# Patient Record
Sex: Male | Born: 1990 | Race: White | Hispanic: No | Marital: Single | Smoking: Current some day smoker
Health system: Southern US, Community
[De-identification: ages and names within clinical notes are randomized; demographics above are authoritative.]

## PROBLEM LIST (undated history)

## (undated) DIAGNOSIS — R569 Unspecified convulsions: Secondary | ICD-10-CM

## (undated) DIAGNOSIS — F410 Panic disorder [episodic paroxysmal anxiety] without agoraphobia: Secondary | ICD-10-CM

## (undated) DIAGNOSIS — K219 Gastro-esophageal reflux disease without esophagitis: Secondary | ICD-10-CM

## (undated) DIAGNOSIS — T7840XA Allergy, unspecified, initial encounter: Secondary | ICD-10-CM

## (undated) DIAGNOSIS — I10 Essential (primary) hypertension: Secondary | ICD-10-CM

## (undated) DIAGNOSIS — F419 Anxiety disorder, unspecified: Secondary | ICD-10-CM

## (undated) DIAGNOSIS — K449 Diaphragmatic hernia without obstruction or gangrene: Secondary | ICD-10-CM

## (undated) DIAGNOSIS — F909 Attention-deficit hyperactivity disorder, unspecified type: Secondary | ICD-10-CM

## (undated) DIAGNOSIS — G709 Myoneural disorder, unspecified: Secondary | ICD-10-CM

## (undated) HISTORY — DX: Unspecified convulsions: R56.9

## (undated) HISTORY — DX: Gastro-esophageal reflux disease without esophagitis: K21.9

## (undated) HISTORY — PX: APPENDECTOMY: SHX54

## (undated) HISTORY — DX: Diaphragmatic hernia without obstruction or gangrene: K44.9

## (undated) HISTORY — DX: Essential (primary) hypertension: I10

## (undated) HISTORY — DX: Anxiety disorder, unspecified: F41.9

## (undated) HISTORY — DX: Myoneural disorder, unspecified: G70.9

## (undated) HISTORY — DX: Attention-deficit hyperactivity disorder, unspecified type: F90.9

## (undated) HISTORY — DX: Allergy, unspecified, initial encounter: T78.40XA

---
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
## 2015-01-23 ENCOUNTER — Ambulatory Visit (INDEPENDENT_AMBULATORY_CARE_PROVIDER_SITE_OTHER): Payer: BLUE CROSS/BLUE SHIELD | Admitting: Physician Assistant

## 2015-01-23 DIAGNOSIS — H9311 Tinnitus, right ear: Secondary | ICD-10-CM

## 2015-01-23 DIAGNOSIS — F909 Attention-deficit hyperactivity disorder, unspecified type: Secondary | ICD-10-CM | POA: Insufficient documentation

## 2015-01-23 NOTE — Progress Notes (Signed)
   Subjective:    Patient ID: Victor [name], male    DOB: [birth year], 24 y.o.   MRN: [id number]  Chief Complaint  Patient presents with  . Tinnitus    Right sided ringing started this am - works w/loud machines, plays the drums  . Ear Pain    Right sided   HPI Patient presents for evaluation of right ear tinnitus since this morning. 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 After practice, he had a HA until he went to bed. Patient is around loud machinery all the time at work, and on his way to work this morning, he noticed increased tinnitus, especially while accelerating. Was convinced it was his car, but   Review of Systems     Objective:   Physical Exam  Constitutional:  BP 128/72 mmHg  Pulse 69  Temp(Src) 98.6 F (37 C) (Oral)  Ht 5\' 7"  (1.702 m)  Wt 157 lb (71.215 kg)  BMI 24.58 kg/m2  SpO2 98%          Assessment & Plan:

## 2015-01-23 NOTE — Progress Notes (Signed)
   Victor [name]  MRN: [id number] DOB: 1990/09/24  Subjective:  Pt presents to clinic with ringing in his ear that started this am.  He was driving his car and he started to hear it and thought it was his car so he took it to the shop where they told him he did not have anything wrong with his car and then he noticed that at work today he kept asking people if they could hear a ringing.  It is just in his right ear - his hearing is a little muffled but he can still hear he thinks most of the problem is the ringing.  He does not have any pain.  He has not had recent cold and he has not had physical trauma to his ear.  He does play in a rock band and they play for a long time last night and he did not wear ear protection.  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  He has never had ringing in his ears before.  Patient Active Problem List   Diagnosis Date Noted  . Attention deficit hyperactivity disorder (ADHD) 01/23/2015    No current outpatient prescriptions on file prior to visit.   No current facility-administered medications on file prior to visit.    No Known Allergies  Review of Systems  Constitutional: Positive for fever.  HENT: Positive for tinnitus (right only). Negative for ear discharge, ear pain and hearing loss.    Objective:  BP 128/72 mmHg  Pulse 69  Temp(Src) 98.6 F (37 C) (Oral)  Ht 5\' 7"  (1.702 m)  Wt 157 lb (71.215 kg)  BMI 24.58 kg/m2  SpO2 98%  Physical Exam  Constitutional: He is oriented to person, place, and time and well-developed, well-nourished, and in no distress.  HENT:  Head: Normocephalic and atraumatic.  Right Ear: Hearing, external ear and ear canal normal. A middle ear effusion (slightly dull TM ) is present.  Left Ear: Hearing, tympanic membrane, external ear and ear canal normal.  Weber does not lateralize. Rhinne AC>BC bilaterally  Eyes:  Conjunctivae are normal.  Neck: Normal range of motion.  Pulmonary/Chest: Effort normal.  Neurological: He is alert and oriented to person, place, and time. Gait normal.  Skin: Skin is warm and dry.  Psychiatric: Mood, memory, affect and judgment normal.    Hearing Screening   Method: Otoacoustic emissions   125Hz  250Hz  500Hz  1000Hz  2000Hz  4000Hz  8000Hz   Right ear:   20 20 20 20    Left ear:   20 20 20 20      Assessment and Plan :  Tinnitus, right   He has a mild ETD in his right ear but I think that his tinnitus is related to his loud music last night therefore this should improve with time.  We discussed the importance of ear protection with loud noises.  He will contact me if it does not improve so we can refer him to a specialist.  Benny LennertSarah Weber PA-C  [facility] 01/23/2015 6:36 PM

## 2015-06-28 ENCOUNTER — Ambulatory Visit (INDEPENDENT_AMBULATORY_CARE_PROVIDER_SITE_OTHER): Payer: BLUE CROSS/BLUE SHIELD

## 2015-06-28 ENCOUNTER — Ambulatory Visit (INDEPENDENT_AMBULATORY_CARE_PROVIDER_SITE_OTHER): Payer: BLUE CROSS/BLUE SHIELD | Admitting: Physician Assistant

## 2015-06-28 VITALS — BP 108/70 | HR 94 | Temp 98.8°F | Resp 18

## 2015-06-28 DIAGNOSIS — S61411A Laceration without foreign body of right hand, initial encounter: Secondary | ICD-10-CM

## 2015-06-28 DIAGNOSIS — M79641 Pain in right hand: Secondary | ICD-10-CM | POA: Diagnosis not present

## 2015-06-28 DIAGNOSIS — S6991XA Unspecified injury of right wrist, hand and finger(s), initial encounter: Secondary | ICD-10-CM

## 2015-06-28 DIAGNOSIS — Z23 Encounter for immunization: Secondary | ICD-10-CM | POA: Diagnosis not present

## 2015-06-28 MED ORDER — IBUPROFEN 800 MG PO TABS: 800.0000 mg | ORAL_TABLET | Freq: Three times a day (TID) | ORAL | Status: DC | PRN

## 2015-06-28 MED ORDER — CEPHALEXIN 500 MG PO CAPS: 1000.0000 mg | ORAL_CAPSULE | Freq: Two times a day (BID) | ORAL | Status: DC

## 2015-06-28 NOTE — Progress Notes (Signed)
06/28/2015 4:48 PM   DOB: [birth year] / MRN: [id number]  SUBJECTIVE:  Victor [name] is a 25 y.o. male presenting for right hand pain and a laceration over the right 5th PIP.  This occurred at 2 am this morning when he punched a car window and the window broke.  He used toilet paper for hemostasis.  He complains of a change in function of the left MCP and associates tenderness.    He has No Known Allergies.   He  has a past medical history of ADHD (attention deficit hyperactivity disorder).    He  reports that he has been smoking Cigarettes.  He has been smoking about 0.01 packs per day. He has never used smokeless tobacco. He  has no sexual activity history on file. The patient  has no past surgical history on file.  His family history is not on file.  Review of Systems  Constitutional: Negative for fever.  Musculoskeletal: Positive for joint pain. Negative for falls.  Skin: Negative for rash.  Neurological: Negative for dizziness and headaches.    Problem list and medications reviewed and updated by myself where necessary, and exist elsewhere in the encounter.   OBJECTIVE:  BP 108/70 mmHg  Pulse 94  Temp(Src) 98.8 F (37.1 C)  Resp 18  SpO2 96%  Physical Exam  Constitutional: He is oriented to person, place, and time.  Cardiovascular: Normal rate and regular rhythm.   Pulmonary/Chest: Effort normal.  Musculoskeletal:       Hands: Neurological: He is alert and oriented to person, place, and time.   Risk and benefits discussed and verbal consent obtained. Anesthetic allergies reviewed. Patient anesthetized using 1:1 mix of 2% lidocaine without epi and Marcaine. The wound was cleansed thoroughly with soap and water. Sterile prep and drape. Wound closed with 3 HM throws using 3-0 Prolene suture material. Hemostasis achieved. Mupirocin applied to the wound and bandage placed. The patient tolerated well.   No results found for this or any previous visit (from the past 72  hour(s)).  Dg Hand Complete Right  06/28/2015  CLINICAL DATA:  Hit windshield with RIGHT hand.  Pain. EXAM: RIGHT HAND - COMPLETE 3+ VIEW COMPARISON:  None. FINDINGS: There is no evidence of fracture or dislocation. There is no evidence of arthropathy or other focal bone abnormality. Lateral soft tissue swelling. Radiodensity adjacent to the PIP joint fifth finger, presumed to represent hyperattenuating soft tissue or bandage. This does not have the appearance of a piece of glass or foreign body. IMPRESSION: Negative for fracture. Soft tissue hyper attenuation adjacent to the PIP joint fifth finger favored to represent bandage or hematoma. Electronically Signed   By: Elsie StainJohn T Curnes M.D.   On: 06/28/2015 13:29    ASSESSMENT AND PLAN  Victor [name] was seen today for laceration.  Diagnoses and all orders for this visit:  Hand injury, right, initial encounter: Negative for displaced fracture.  He has been placed in a splint and will see him back in three days.  Given the severity of the laceration with start him on Keflex 1000 bid. He will return in three days for a recheck to see Dr. Neva SeatGreene or myself.   -     DG Hand Complete Right; Future  Hand pain, right -     ibuprofen (ADVIL,MOTRIN) 800 MG tablet; Take 1 tablet (800 mg total) by mouth every 8 (eight) hours as needed.  Hand laceration, right, initial encounter -     Tdap vaccine greater than or equal to  7yo IM -     cephALEXin (KEFLEX) 500 MG capsule; Take 2 capsules (1,000 mg total) by mouth 2 (two) times daily.  The patient was advised to call or return to clinic if he does not see an improvement in symptoms or to seek the care of the closest emergency department if he worsens with the above plan.   Deliah Boston, MHS, PA-C [facility] 06/28/2015 4:48 PM

## 2015-06-28 NOTE — Patient Instructions (Signed)
     IF you received an x-ray today, you will receive an invoice from [facility]. Please contact [facility] at [phone] with questions or concerns regarding your invoice.   IF you received labwork today, you will receive an invoice from [facility]. Please contact [facility] at [phone] with questions or concerns regarding your invoice.   Our billing staff will not be able to assist you with questions regarding bills from these companies.  You will be contacted with the lab results as soon as they are available. The fastest way to get your results is to activate your My Chart account. Instructions are located on the last page of this paperwork. If you have not heard from us regarding the results in 2 weeks, please contact this office.      

## 2015-07-01 ENCOUNTER — Ambulatory Visit (INDEPENDENT_AMBULATORY_CARE_PROVIDER_SITE_OTHER): Payer: BLUE CROSS/BLUE SHIELD | Admitting: Physician Assistant

## 2015-07-01 VITALS — BP 120/80 | HR 79 | Temp 98.4°F | Resp 16 | Ht 67.5 in | Wt 164.8 lb

## 2015-07-01 DIAGNOSIS — S6991XD Unspecified injury of right wrist, hand and finger(s), subsequent encounter: Secondary | ICD-10-CM

## 2015-07-01 NOTE — Progress Notes (Signed)
Chief Complaint  Patient presents with  . Follow-up    hand injury    History of Present Illness: Patient presents for recheck after injury to the RIGHT hand 06/28/2015.  He punched the window of his car sustaining laceration to the dorsal aspect of the proximal 5th finger. No fracture seen on radiographs. No tendon injury identified. The wound was repaired and he was placed in a splint (ulnar gutter).  He presents today reporting decreased swelling and pain. Swelling and pain are worse at the end of the day. The splint is loose and wiggles a bit. He is tolerating the Keflex but with a little diarrhea, which is tolerable.   No Known Allergies  Prior to Admission medications   Medication Sig Start Date End Date Taking? Authorizing Provider  amphetamine-dextroamphetamine (ADDERALL) 10 MG tablet Take 10 mg by mouth as needed.   Yes Historical Provider, MD  cephALEXin (KEFLEX) 500 MG capsule Take 2 capsules (1,000 mg total) by mouth 2 (two) times daily. 06/28/15  Yes Ofilia NeasMichael L Clark, PA-C  ibuprofen (ADVIL,MOTRIN) 800 MG tablet Take 1 tablet (800 mg total) by mouth every 8 (eight) hours as needed. 06/28/15  Yes Ofilia NeasMichael L Clark, PA-C  lisdexamfetamine (VYVANSE) 40 MG capsule Take 40 mg by mouth every morning.   Yes Historical Provider, MD    Patient Active Problem List   Diagnosis Date Noted  . Attention deficit hyperactivity disorder (ADHD) 01/23/2015     Physical Exam  Constitutional: He is oriented to person, place, and time. He appears well-developed and well-nourished. He is active and cooperative. No distress.  BP 120/80 mmHg  Pulse 79  Temp(Src) 98.4 F (36.9 C) (Oral)  Resp 16  Ht 5' 7.5" (1.715 m)  Wt 164 lb 12.8 oz (74.753 kg)  BMI 25.42 kg/m2  SpO2 98%   Eyes: Conjunctivae are normal.  Pulmonary/Chest: Effort normal.  Musculoskeletal:  Splint removed and hand washed. Wound is closed and sutures intact. No erythema. No drainage. Edema is significant of the dorsum of the  RIGHT hand, with resolving ecchymosis. ROM of the wrist is "sore," but the wrist is non-tender. ROM of the fingers and hand is reduced, likely due to pain and swelling. He has intact strength, but mild pain of the 5th finger with extension against resistance.  Finger splint placed with coban for extra protection until suture removal.  Neurological: He is alert and oriented to person, place, and time.  Psychiatric: He has a normal mood and affect. His speech is normal and behavior is normal.      ASSESSMENT & PLAN:  1. Hand injury, right, subsequent encounter Improving, but with persistent pain and swelling. Smaller finger splint. Remove splint for bathing. Gentle ROM of the wrist and fingers, but if pain increases he needs to notify us and may need a more substantial splint.  Return in about 5 days (around 07/06/2015) for wound check/suture removal, sooner if pain increasing.   Fernande Brashelle S. Torrey Horseman, PA-C Physician Assistant-Certified [facility]

## 2015-07-01 NOTE — Patient Instructions (Signed)
     IF you received an x-ray today, you will receive an invoice from [facility]. Please contact [facility] at [phone] with questions or concerns regarding your invoice.   IF you received labwork today, you will receive an invoice from [facility]. Please contact [facility] at [phone] with questions or concerns regarding your invoice.   Our billing staff will not be able to assist you with questions regarding bills from these companies.  You will be contacted with the lab results as soon as they are available. The fastest way to get your results is to activate your My Chart account. Instructions are located on the last page of this paperwork. If you have not heard from us regarding the results in 2 weeks, please contact this office.      

## 2015-07-09 ENCOUNTER — Ambulatory Visit (INDEPENDENT_AMBULATORY_CARE_PROVIDER_SITE_OTHER): Payer: BLUE CROSS/BLUE SHIELD | Admitting: Family Medicine

## 2015-07-09 ENCOUNTER — Ambulatory Visit (INDEPENDENT_AMBULATORY_CARE_PROVIDER_SITE_OTHER): Payer: BLUE CROSS/BLUE SHIELD

## 2015-07-09 VITALS — BP 136/74 | HR 99 | Temp 98.2°F | Resp 16 | Ht 67.5 in | Wt 164.0 lb

## 2015-07-09 DIAGNOSIS — S61206D Unspecified open wound of right little finger without damage to nail, subsequent encounter: Secondary | ICD-10-CM

## 2015-07-09 DIAGNOSIS — M79641 Pain in right hand: Secondary | ICD-10-CM | POA: Diagnosis not present

## 2015-07-09 DIAGNOSIS — S61209D Unspecified open wound of unspecified finger without damage to nail, subsequent encounter: Secondary | ICD-10-CM

## 2015-07-09 NOTE — Patient Instructions (Addendum)
     IF you received an x-ray today, you will receive an invoice from [facility]. Please contact [facility] at [phone] with questions or concerns regarding your invoice.   IF you received labwork today, you will receive an invoice from [facility]. Please contact [facility] at [phone] with questions or concerns regarding your invoice.   Our billing staff will not be able to assist you with questions regarding bills from these companies.  You will be contacted with the lab results as soon as they are available. The fastest way to get your results is to activate your My Chart account. Instructions are located on the last page of this paperwork. If you have not heard from us regarding the results in 2 weeks, please contact this office.     Wound appears to be healing well. Try to avoid forceful grasping or gripping/heavy lifting with the right hand/wrist for the next week or 2, but if pain has resolved, can increase activity with that hand. If any increased redness or swelling of the hand or other worsening symptoms, return for recheck.  Good luck [place], let us know if we can help you before that time with stress or stress management.

## 2015-07-09 NOTE — Progress Notes (Signed)
Subjective:  By signing my name below, I, Victor [name], attest that this documentation has been prepared under the direction and in the presence of Meredith Staggers, MD. Electronically Signed: Stann [name], Scribe. 07/09/2015 , 4:24 PM .  Patient was seen in Room 1 .   Patient ID: Victor [name], male    DOB: 27-Aug-1990, 25 y.o.   MRN: [id number] Chief Complaint  Patient presents with  . Suture / Staple Removal    on right pinky; finger is a little sore   HPI Victor [name] is a 25 y.o. male He was seen initially by Deliah Boston, PA on 3/26 for a wound to his right hand after punching window glass causing a laceration over right 5th digit; he was repaired with 3 horizontal mattress sutures and also placed in a splint, started keflex  bid. Rechecked 3 days later, splint replaced, gentle rom recommended on 3/29 visit. His initial xray was negative for fracture.   Patient states the area is still feels a little sore. He's stopped using the splint 2 days ago because it was feeling better. He notes the top part of his hand has some pain. He denies any bleeding or pus draining from the wound.   He Administrator, arts for [employer].   Patient Active Problem List   Diagnosis Date Noted  . Attention deficit hyperactivity disorder (ADHD) 01/23/2015   Past Medical History  Diagnosis Date  . ADHD (attention deficit hyperactivity disorder)    History reviewed. No pertinent past surgical history. No Known Allergies Prior to Admission medications   Medication Sig Start Date End Date Taking? Authorizing Provider  amphetamine-dextroamphetamine (ADDERALL) 10 MG tablet Take 10 mg by mouth as needed.   Yes Historical Provider, MD  cephALEXin (KEFLEX) 500 MG capsule Take 2 capsules (1,000 mg total) by mouth 2 (two) times daily. 06/28/15  Yes [name], PA-C  ibuprofen (ADVIL,MOTRIN) 800 MG tablet Take 1 tablet (800 mg total) by mouth every 8 (eight) hours as needed. 06/28/15  Yes [name], PA-C  lisdexamfetamine (VYVANSE) 40 MG capsule Take 40 mg by mouth every morning.   Yes Historical Provider, MD   Social History   Social History  . Marital Status: Single    Spouse Name: n/a  . Number of Children: 0  . Years of Education: Associates   Occupational History  . Hidden Associate Professor    Social History Main Topics  . Smoking status: Former Smoker -- 0.01 packs/day    Types: Cigarettes    Quit date: 04/01/2015  . Smokeless tobacco: Never Used  . Alcohol Use: Not on file  . Drug Use: Not on file  . Sexual Activity: Not on file   Other Topics Concern  . Not on file   Social History Narrative   Review of Systems  Constitutional: Negative for fever, chills and fatigue.  Respiratory: Negative for cough.   Gastrointestinal: Negative for nausea, vomiting and diarrhea.  Musculoskeletal: Positive for myalgias. Negative for joint swelling and arthralgias.  Skin: Negative for rash and wound.  Neurological: Negative for weakness and numbness.      Objective:   Physical Exam  Constitutional: He is oriented to person, place, and time. He appears well-developed and well-nourished. No distress.  HENT:  Head: Normocephalic and atraumatic.  Eyes: EOM are normal. Pupils are equal, round, and reactive to light.  Neck: Neck supple.  Cardiovascular: Normal rate.   Pulmonary/Chest: Effort normal. No respiratory distress.  Musculoskeletal: Normal range of  motion.  Right 5th finger: there are 3 HM sutures intact, no drainage or bleeding, no surrounding erythema, no warmth, able to straighten without difficulty, possible prominence over right PIP, slight discomfort over lateral PIP; able to achieve full extension, 90 degrees flexion of the PIP, intact flexion strength at PIP Right hand: slight tenderness over dorsum of 4th metacarpal base with questionable bony prominence, scaphoid non tender, right wrist full rom, no bony tenderness   Neurological: He is alert and  oriented to person, place, and time.  Skin: Skin is warm and dry.  Psychiatric: He has a normal mood and affect. His behavior is normal.  Nursing note and vitals reviewed.   Filed Vitals:   07/09/15 1555  BP: 136/74  Pulse: 99  Temp: 98.2 F (36.8 C)  TempSrc: Oral  Resp: 16  Height: 5' 7.5" (1.715 m)  Weight: 164 lb (74.39 kg)  SpO2: 98%   Dg Hand Complete Right  07/09/2015  CLINICAL DATA:  Laceration of the right hand, evaluate for fracture EXAM: RIGHT HAND - COMPLETE 3+ VIEW COMPARISON:  Right hand films of 06/28/2015 FINDINGS: There is soft tissue swelling at the level of the right fifth PIP joint. However no fracture is seen and alignment is normal. Joint spaces appear normal. IMPRESSION: Negative. Electronically Signed   By: Dwyane Dee M.D.   On: 07/09/2015 16:37      Assessment & Plan:   [name] is a 24 y.o. male Wound, open, finger, subsequent encounter - Plan: DG Hand Complete Right  Right hand pain - Plan: DG Hand Complete Right Wound healing well, horizontal mattress sutures were removed without difficulty. Wound RTC precautions discussed. Avoid forceful grasping for another week or 2 continue range of motion. If pain persists, return for recheck.   -Denied any depression symptoms currently, does have number for counseling to his family has recommended for stress and stress management. Encouraged him to pursue this, and let us know if we can help further.  Upcoming hike on [place] trail may also help.   No orders of the defined types were placed in this encounter.   Patient Instructions       IF you received an x-ray today, you will receive an invoice from [facility]. Please contact [facility] at [phone] with questions or concerns regarding your invoice.   IF you received labwork today, you will receive an invoice from [facility]. Please contact [facility] at [phone] with questions or concerns  regarding your invoice.   Our billing staff will not be able to assist you with questions regarding bills from these companies.  You will be contacted with the lab results as soon as they are available. The fastest way to get your results is to activate your My Chart account. Instructions are located on the last page of this paperwork. If you have not heard from [place] regarding the results in 2 weeks, please contact this office.     Wound appears to be healing well. Try to avoid forceful grasping or gripping/heavy lifting with the right hand/wrist for the next week or 2, but if pain has resolved, can increase activity with that hand. If any increased redness or swelling of the hand or other worsening symptoms, return for recheck.  Good luck [place], let us know if we can help you before that time with stress or stress management.    I personally performed the services described in this documentation, which was scribed in my  presence. The recorded information has been reviewed and considered, and addended by me as needed.

## 2015-08-03 ENCOUNTER — Encounter (HOSPITAL_COMMUNITY): Payer: Self-pay | Admitting: *Deleted

## 2015-08-03 ENCOUNTER — Observation Stay (HOSPITAL_COMMUNITY): Payer: BLUE CROSS/BLUE SHIELD | Admitting: Anesthesiology

## 2015-08-03 ENCOUNTER — Emergency Department (HOSPITAL_COMMUNITY): Payer: BLUE CROSS/BLUE SHIELD

## 2015-08-03 ENCOUNTER — Observation Stay (HOSPITAL_COMMUNITY)
Admission: EM | Admit: 2015-08-03 | Discharge: 2015-08-04 | Disposition: A | Discharge status: Home / Self Care | Payer: BLUE CROSS/BLUE SHIELD | Attending: Surgery | Admitting: Surgery

## 2015-08-03 ENCOUNTER — Encounter (HOSPITAL_COMMUNITY)
Admission: EM | Disposition: A | Discharge status: Home / Self Care | Payer: Self-pay | Source: Home / Self Care | Attending: Emergency Medicine

## 2015-08-03 DIAGNOSIS — F419 Anxiety disorder, unspecified: Secondary | ICD-10-CM | POA: Diagnosis not present

## 2015-08-03 DIAGNOSIS — Z87891 Personal history of nicotine dependence: Secondary | ICD-10-CM | POA: Insufficient documentation

## 2015-08-03 DIAGNOSIS — F909 Attention-deficit hyperactivity disorder, unspecified type: Secondary | ICD-10-CM | POA: Diagnosis not present

## 2015-08-03 DIAGNOSIS — M79641 Pain in right hand: Secondary | ICD-10-CM

## 2015-08-03 DIAGNOSIS — F329 Major depressive disorder, single episode, unspecified: Secondary | ICD-10-CM | POA: Diagnosis not present

## 2015-08-03 DIAGNOSIS — K3580 Unspecified acute appendicitis: Secondary | ICD-10-CM | POA: Diagnosis present

## 2015-08-03 HISTORY — PX: LAPAROSCOPIC APPENDECTOMY: SHX408

## 2015-08-03 LAB — LIPASE, BLOOD: LIPASE: 17 U/L (ref 11–51)

## 2015-08-03 LAB — COMPREHENSIVE METABOLIC PANEL
ALK PHOS: 53 U/L (ref 38–126)
ALT: 42 U/L (ref 17–63)
AST: 40 U/L (ref 15–41)
Albumin: 4.2 g/dL (ref 3.5–5.0)
Anion gap: 12 (ref 5–15)
BUN: 12 mg/dL (ref 6–20)
CALCIUM: 9.7 mg/dL (ref 8.9–10.3)
CHLORIDE: 99 mmol/L — AB (ref 101–111)
CO2: 23 mmol/L (ref 22–32)
CREATININE: 0.8 mg/dL (ref 0.61–1.24)
GFR calc non Af Amer: >60 mL/min (ref >60)
GLUCOSE: 128 mg/dL — AB (ref 65–99)
Potassium: 4.1 mmol/L (ref 3.5–5.1)
SODIUM: 134 mmol/L — AB (ref 135–145)
Total Bilirubin: 0.8 mg/dL (ref 0.3–1.2)
Total Protein: 6.9 g/dL (ref 6.5–8.1)

## 2015-08-03 LAB — CBC
HCT: 42.1 % (ref 39.0–52.0)
Hemoglobin: 14.5 g/dL (ref 13.0–17.0)
MCH: 30.1 pg (ref 26.0–34.0)
MCHC: 34.4 g/dL (ref 30.0–36.0)
MCV: 87.3 fL (ref 78.0–100.0)
PLATELETS: 258 10*3/uL (ref 150–400)
RBC: 4.82 MIL/uL (ref 4.22–5.81)
RDW: 12.6 % (ref 11.5–15.5)
WBC: 14.5 10*3/uL — ABNORMAL HIGH (ref 4.0–10.5)

## 2015-08-03 LAB — URINALYSIS, ROUTINE W REFLEX MICROSCOPIC
BILIRUBIN URINE: NEGATIVE
GLUCOSE, UA: NEGATIVE mg/dL
HGB URINE DIPSTICK: NEGATIVE
KETONES UR: 15 mg/dL — AB
Leukocytes, UA: NEGATIVE
Nitrite: NEGATIVE
PROTEIN: NEGATIVE mg/dL
Specific Gravity, Urine: 1.028 (ref 1.005–1.030)
pH: 6.5 (ref 5.0–8.0)

## 2015-08-03 SURGERY — APPENDECTOMY, LAPAROSCOPIC
Anesthesia: General | Site: Abdomen

## 2015-08-03 MED ORDER — ONDANSETRON HCL 4 MG/2ML IJ SOLN
4.0000 mg | Freq: Four times a day (QID) | INTRAMUSCULAR | Status: DC | PRN
  Administered 2015-08-03: 4 mg via INTRAVENOUS
  Filled 2015-08-03: qty 2

## 2015-08-03 MED ORDER — DEXAMETHASONE SODIUM PHOSPHATE 10 MG/ML IJ SOLN
INTRAMUSCULAR | Status: AC
  Filled 2015-08-03: qty 1

## 2015-08-03 MED ORDER — OXYCODONE-ACETAMINOPHEN 5-325 MG PO TABS
1.0000 | ORAL_TABLET | ORAL | Status: DC | PRN
  Administered 2015-08-03 – 2015-08-04 (×2): 2 via ORAL
  Filled 2015-08-03 (×2): qty 2

## 2015-08-03 MED ORDER — SODIUM CHLORIDE 0.9 % IV SOLN
INTRAVENOUS | Status: DC
Start: 2015-08-03 — End: 2015-08-03
  Administered 2015-08-03: 10:00:00 via INTRAVENOUS

## 2015-08-03 MED ORDER — IBUPROFEN 600 MG PO TABS: 600.0000 mg | ORAL_TABLET | Freq: Four times a day (QID) | ORAL | Status: DC | PRN

## 2015-08-03 MED ORDER — DEXTROSE 5 % IV SOLN: 2.0000 g | INTRAVENOUS | Status: DC

## 2015-08-03 MED ORDER — SUGAMMADEX SODIUM 200 MG/2ML IV SOLN
INTRAVENOUS | Status: AC
  Filled 2015-08-03: qty 2

## 2015-08-03 MED ORDER — LISDEXAMFETAMINE DIMESYLATE 20 MG PO CAPS
40.0000 mg | ORAL_CAPSULE | Freq: Every morning | ORAL | Status: DC
  Filled 2015-08-03: qty 2

## 2015-08-03 MED ORDER — LACTATED RINGERS IV SOLN
INTRAVENOUS | Status: DC
  Administered 2015-08-03: 12:00:00 via INTRAVENOUS

## 2015-08-03 MED ORDER — FENTANYL CITRATE (PF) 250 MCG/5ML IJ SOLN
INTRAMUSCULAR | Status: AC
  Filled 2015-08-03: qty 5

## 2015-08-03 MED ORDER — METHOCARBAMOL 500 MG PO TABS
500.0000 mg | ORAL_TABLET | Freq: Four times a day (QID) | ORAL | Status: DC | PRN
  Administered 2015-08-04: 500 mg via ORAL
  Filled 2015-08-03: qty 1

## 2015-08-03 MED ORDER — ENOXAPARIN SODIUM 40 MG/0.4ML ~~LOC~~ SOLN
40.0000 mg | SUBCUTANEOUS | Status: DC
  Administered 2015-08-04: 40 mg via SUBCUTANEOUS
  Filled 2015-08-03: qty 0.4

## 2015-08-03 MED ORDER — DEXTROSE 5 % IV SOLN
2.0000 g | INTRAVENOUS | Status: DC
  Filled 2015-08-03: qty 2

## 2015-08-03 MED ORDER — DIPHENHYDRAMINE HCL 25 MG PO CAPS: 25.0000 mg | ORAL_CAPSULE | Freq: Four times a day (QID) | ORAL | Status: DC | PRN

## 2015-08-03 MED ORDER — DEXTROSE 5 % IV SOLN
2.0000 g | Freq: Once | INTRAVENOUS | Status: AC
  Administered 2015-08-03: 2 g via INTRAVENOUS
  Filled 2015-08-03: qty 2

## 2015-08-03 MED ORDER — LIDOCAINE HCL (CARDIAC) 20 MG/ML IV SOLN
INTRAVENOUS | Status: DC | PRN
  Administered 2015-08-03: 100 mg via INTRAVENOUS

## 2015-08-03 MED ORDER — DIPHENHYDRAMINE HCL 50 MG/ML IJ SOLN: 25.0000 mg | Freq: Four times a day (QID) | INTRAMUSCULAR | Status: DC | PRN

## 2015-08-03 MED ORDER — BUPIVACAINE-EPINEPHRINE 0.25% -1:200000 IJ SOLN
INTRAMUSCULAR | Status: DC | PRN
  Administered 2015-08-03: 6 mL

## 2015-08-03 MED ORDER — ONDANSETRON HCL 4 MG/2ML IJ SOLN: 4.0000 mg | Freq: Four times a day (QID) | INTRAMUSCULAR | Status: DC | PRN

## 2015-08-03 MED ORDER — FENTANYL CITRATE (PF) 100 MCG/2ML IJ SOLN
INTRAMUSCULAR | Status: DC | PRN
  Administered 2015-08-03: 50 ug via INTRAVENOUS
  Administered 2015-08-03: 100 ug via INTRAVENOUS
  Administered 2015-08-03: 50 ug via INTRAVENOUS

## 2015-08-03 MED ORDER — SUCCINYLCHOLINE CHLORIDE 200 MG/10ML IV SOSY
PREFILLED_SYRINGE | INTRAVENOUS | Status: AC
  Filled 2015-08-03: qty 10

## 2015-08-03 MED ORDER — HYDROMORPHONE HCL 1 MG/ML IJ SOLN: 0.2500 mg | INTRAMUSCULAR | Status: DC | PRN

## 2015-08-03 MED ORDER — ONDANSETRON HCL 4 MG/2ML IJ SOLN
4.0000 mg | Freq: Once | INTRAMUSCULAR | Status: AC
  Administered 2015-08-03: 4 mg via INTRAVENOUS
  Filled 2015-08-03: qty 2

## 2015-08-03 MED ORDER — SUGAMMADEX SODIUM 200 MG/2ML IV SOLN
INTRAVENOUS | Status: DC | PRN
  Administered 2015-08-03: 200 mg via INTRAVENOUS

## 2015-08-03 MED ORDER — METRONIDAZOLE IN NACL 5-0.79 MG/ML-% IV SOLN
500.0000 mg | Freq: Once | INTRAVENOUS | Status: AC
  Administered 2015-08-03: 500 mg via INTRAVENOUS
  Filled 2015-08-03: qty 100

## 2015-08-03 MED ORDER — ONDANSETRON HCL 4 MG/2ML IJ SOLN
INTRAMUSCULAR | Status: DC | PRN
  Administered 2015-08-03: 4 mg via INTRAVENOUS

## 2015-08-03 MED ORDER — HYDROMORPHONE HCL 1 MG/ML IJ SOLN
1.0000 mg | INTRAMUSCULAR | Status: DC | PRN
  Administered 2015-08-03 (×2): 1 mg via INTRAVENOUS
  Filled 2015-08-03 (×2): qty 1

## 2015-08-03 MED ORDER — ROCURONIUM BROMIDE 100 MG/10ML IV SOLN
INTRAVENOUS | Status: DC | PRN
  Administered 2015-08-03: 40 mg via INTRAVENOUS

## 2015-08-03 MED ORDER — METRONIDAZOLE IN NACL 5-0.79 MG/ML-% IV SOLN
500.0000 mg | Freq: Three times a day (TID) | INTRAVENOUS | Status: DC
  Administered 2015-08-03 – 2015-08-04 (×2): 500 mg via INTRAVENOUS
  Filled 2015-08-03 (×5): qty 100

## 2015-08-03 MED ORDER — ARTIFICIAL TEARS OP OINT
TOPICAL_OINTMENT | OPHTHALMIC | Status: DC | PRN
Start: 2015-08-03 — End: 2015-08-03
  Administered 2015-08-03: 1 via OPHTHALMIC

## 2015-08-03 MED ORDER — SODIUM CHLORIDE 0.9 % IR SOLN
Status: DC | PRN
  Administered 2015-08-03: 1000 mL

## 2015-08-03 MED ORDER — KETOROLAC TROMETHAMINE 30 MG/ML IJ SOLN
30.0000 mg | Freq: Four times a day (QID) | INTRAMUSCULAR | Status: DC
  Administered 2015-08-03 – 2015-08-04 (×3): 30 mg via INTRAVENOUS
  Filled 2015-08-03 (×3): qty 1

## 2015-08-03 MED ORDER — SODIUM CHLORIDE 0.9 % IV SOLN
INTRAVENOUS | Status: DC
  Administered 2015-08-03: 17:00:00 via INTRAVENOUS

## 2015-08-03 MED ORDER — HYDROMORPHONE HCL 1 MG/ML IJ SOLN: 0.5000 mg | INTRAMUSCULAR | Status: DC | PRN

## 2015-08-03 MED ORDER — LIDOCAINE 2% (20 MG/ML) 5 ML SYRINGE
INTRAMUSCULAR | Status: AC
  Filled 2015-08-03: qty 5

## 2015-08-03 MED ORDER — BUPIVACAINE-EPINEPHRINE (PF) 0.25% -1:200000 IJ SOLN
INTRAMUSCULAR | Status: AC
  Filled 2015-08-03: qty 30

## 2015-08-03 MED ORDER — SUCCINYLCHOLINE CHLORIDE 20 MG/ML IJ SOLN
INTRAMUSCULAR | Status: DC | PRN
  Administered 2015-08-03: 120 mg via INTRAVENOUS

## 2015-08-03 MED ORDER — METRONIDAZOLE IN NACL 5-0.79 MG/ML-% IV SOLN
500.0000 mg | Freq: Three times a day (TID) | INTRAVENOUS | Status: DC
  Administered 2015-08-03: 500 mg via INTRAVENOUS
  Filled 2015-08-03 (×2): qty 100

## 2015-08-03 MED ORDER — 0.9 % SODIUM CHLORIDE (POUR BTL) OPTIME
TOPICAL | Status: DC | PRN
  Administered 2015-08-03: 1000 mL

## 2015-08-03 MED ORDER — ONDANSETRON HCL 4 MG/2ML IJ SOLN
INTRAMUSCULAR | Status: AC
  Filled 2015-08-03: qty 4

## 2015-08-03 MED ORDER — ONDANSETRON 4 MG PO TBDP: 4.0000 mg | ORAL_TABLET | Freq: Four times a day (QID) | ORAL | Status: DC | PRN

## 2015-08-03 MED ORDER — PROPOFOL 10 MG/ML IV BOLUS
INTRAVENOUS | Status: AC
  Filled 2015-08-03: qty 20

## 2015-08-03 MED ORDER — ONDANSETRON HCL 4 MG/2ML IJ SOLN
INTRAMUSCULAR | Status: AC
  Filled 2015-08-03: qty 2

## 2015-08-03 MED ORDER — MIDAZOLAM HCL 5 MG/5ML IJ SOLN
INTRAMUSCULAR | Status: DC | PRN
  Administered 2015-08-03: 2 mg via INTRAVENOUS

## 2015-08-03 MED ORDER — KETOROLAC TROMETHAMINE 30 MG/ML IJ SOLN: 30.0000 mg | Freq: Four times a day (QID) | INTRAMUSCULAR | Status: DC | PRN

## 2015-08-03 MED ORDER — ONDANSETRON HCL 4 MG/2ML IJ SOLN: 4.0000 mg | Freq: Once | INTRAMUSCULAR | Status: DC | PRN

## 2015-08-03 MED ORDER — IOPAMIDOL (ISOVUE-300) INJECTION 61%
INTRAVENOUS | Status: AC
  Administered 2015-08-03: 100 mL
  Filled 2015-08-03: qty 100

## 2015-08-03 MED ORDER — DEXAMETHASONE SODIUM PHOSPHATE 10 MG/ML IJ SOLN
INTRAMUSCULAR | Status: DC | PRN
  Administered 2015-08-03: 8 mg via INTRAVENOUS

## 2015-08-03 MED ORDER — PROPOFOL 10 MG/ML IV BOLUS
INTRAVENOUS | Status: DC | PRN
  Administered 2015-08-03: 50 mg via INTRAVENOUS
  Administered 2015-08-03: 150 mg via INTRAVENOUS

## 2015-08-03 MED ORDER — MIDAZOLAM HCL 2 MG/2ML IJ SOLN
INTRAMUSCULAR | Status: AC
  Filled 2015-08-03: qty 2

## 2015-08-03 MED ORDER — HYDROMORPHONE HCL 1 MG/ML IJ SOLN
0.5000 mg | INTRAMUSCULAR | Status: AC | PRN
  Administered 2015-08-03 (×3): 0.5 mg via INTRAVENOUS
  Filled 2015-08-03 (×3): qty 1

## 2015-08-03 MED ORDER — ROCURONIUM BROMIDE 50 MG/5ML IV SOLN
INTRAVENOUS | Status: AC
  Filled 2015-08-03: qty 1

## 2015-08-03 MED ORDER — POTASSIUM CHLORIDE IN NACL 20-0.9 MEQ/L-% IV SOLN: INTRAVENOUS | Status: DC

## 2015-08-03 MED ORDER — SODIUM CHLORIDE 0.9 % IV BOLUS (SEPSIS)
1000.0000 mL | Freq: Once | INTRAVENOUS | Status: AC
  Administered 2015-08-03: 1000 mL via INTRAVENOUS

## 2015-08-03 SURGICAL SUPPLY — 46 items
APL SKNCLS STERI-STRIP NONHPOA (GAUZE/BANDAGES/DRESSINGS) ×1
APPLIER CLIP ROT 10 11.4 M/L (STAPLE)
APR CLP MED LRG 11.4X10 (STAPLE)
BAG SPEC RTRVL LRG 6X4 10 (ENDOMECHANICALS) ×1
BENZOIN TINCTURE PRP APPL 2/3 (GAUZE/BANDAGES/DRESSINGS) ×2 IMPLANT
BLADE SURG ROTATE 9660 (MISCELLANEOUS) IMPLANT
CANISTER SUCTION 2500CC (MISCELLANEOUS) ×2 IMPLANT
CHLORAPREP W/TINT 26ML (MISCELLANEOUS) ×2 IMPLANT
CLIP APPLIE ROT 10 11.4 M/L (STAPLE) IMPLANT
COVER SURGICAL LIGHT HANDLE (MISCELLANEOUS) ×2 IMPLANT
CUTTER FLEX LINEAR 45M (STAPLE) ×2 IMPLANT
DRSG TEGADERM 2-3/8X2-3/4 SM (GAUZE/BANDAGES/DRESSINGS) ×4 IMPLANT
DRSG TEGADERM 4X4.75 (GAUZE/BANDAGES/DRESSINGS) ×2 IMPLANT
ELECT REM PT RETURN 9FT ADLT (ELECTROSURGICAL) ×2
ELECTRODE REM PT RTRN 9FT ADLT (ELECTROSURGICAL) ×1 IMPLANT
ENDOLOOP SUT PDS II  0 18 (SUTURE)
ENDOLOOP SUT PDS II 0 18 (SUTURE) IMPLANT
FILTER SMOKE EVAC LAPAROSHD (FILTER) ×2 IMPLANT
GAUZE SPONGE 2X2 8PLY STRL LF (GAUZE/BANDAGES/DRESSINGS) ×1 IMPLANT
GLOVE BIO SURGEON STRL SZ 6.5 (GLOVE) ×1 IMPLANT
GLOVE BIO SURGEON STRL SZ7 (GLOVE) ×2 IMPLANT
GLOVE BIOGEL PI IND STRL 7.0 (GLOVE) IMPLANT
GLOVE BIOGEL PI IND STRL 7.5 (GLOVE) ×1 IMPLANT
GLOVE BIOGEL PI INDICATOR 7.0 (GLOVE) ×1
GLOVE BIOGEL PI INDICATOR 7.5 (GLOVE) ×1
GOWN STRL REUS W/ TWL LRG LVL3 (GOWN DISPOSABLE) ×3 IMPLANT
GOWN STRL REUS W/TWL LRG LVL3 (GOWN DISPOSABLE) ×6
KIT BASIN OR (CUSTOM PROCEDURE TRAY) ×2 IMPLANT
KIT ROOM TURNOVER OR (KITS) ×2 IMPLANT
NS IRRIG 1000ML POUR BTL (IV SOLUTION) ×2 IMPLANT
PAD ARMBOARD 7.5X6 YLW CONV (MISCELLANEOUS) ×4 IMPLANT
POUCH SPECIMEN RETRIEVAL 10MM (ENDOMECHANICALS) ×2 IMPLANT
RELOAD STAPLE 45 3.5 BLU ETS (ENDOMECHANICALS) ×1 IMPLANT
RELOAD STAPLE TA45 3.5 REG BLU (ENDOMECHANICALS) ×2 IMPLANT
SCALPEL HARMONIC ACE (MISCELLANEOUS) ×2 IMPLANT
SET IRRIG TUBING LAPAROSCOPIC (IRRIGATION / IRRIGATOR) ×2 IMPLANT
SPECIMEN JAR SMALL (MISCELLANEOUS) ×2 IMPLANT
SPONGE GAUZE 2X2 STER 10/PKG (GAUZE/BANDAGES/DRESSINGS) ×1
STRIP CLOSURE SKIN 1/2X4 (GAUZE/BANDAGES/DRESSINGS) ×2 IMPLANT
SUT MNCRL AB 4-0 PS2 18 (SUTURE) ×2 IMPLANT
TOWEL OR 17X24 6PK STRL BLUE (TOWEL DISPOSABLE) ×2 IMPLANT
TOWEL OR 17X26 10 PK STRL BLUE (TOWEL DISPOSABLE) ×2 IMPLANT
TRAY LAPAROSCOPIC MC (CUSTOM PROCEDURE TRAY) ×2 IMPLANT
TROCAR XCEL BLADELESS 5X75MML (TROCAR) ×4 IMPLANT
TROCAR XCEL BLUNT TIP 100MML (ENDOMECHANICALS) ×2 IMPLANT
TUBING INSUFFLATION (TUBING) ×2 IMPLANT

## 2015-08-03 NOTE — ED Notes (Signed)
Patient states about  2 this AM he woke up with extreme abd pain

## 2015-08-03 NOTE — ED Provider Notes (Signed)
CSN: [id number]     Arrival date & time 08/03/15  0611 History   First MD Initiated Contact with Patient 08/03/15 [phone]     Chief Complaint  Patient presents with  . Abdominal Pain    Patient is a 25 y.o. male presenting with abdominal pain. The history is provided by the patient.  Abdominal Pain Pain location:  Epigastric Pain quality: stabbing   Pain quality comment:  Initially felt like a vague ache, now is a sharp pain in the upper abdomen Pain radiation: toward the lower abdomen. Pain severity:  Severe Onset quality:  Gradual Duration: started last night before bed but it was mild. Timing:  Constant Progression:  Waxing and waning Context: not recent illness, not recent travel, not sick contacts and not trauma   Relieved by:  Nothing Exacerbated by: lying on the left side. Ineffective treatments:  None tried Associated symptoms: nausea and vomiting   Associated symptoms: no constipation, no diarrhea, no fever and no hematuria     Past Medical History  Diagnosis Date  . ADHD (attention deficit hyperactivity disorder)    History reviewed. No pertinent past surgical history. No family history on file. Social History  Substance Use Topics  . Smoking status: Former Smoker -- 0.01 packs/day    Types: Cigarettes    Quit date: 04/01/2015  . Smokeless tobacco: Never Used  . Alcohol Use: 0.0 oz/week    0 Standard drinks or equivalent per week    Review of Systems  Constitutional: Negative for fever.  Gastrointestinal: Positive for nausea, vomiting and abdominal pain. Negative for diarrhea and constipation.  Genitourinary: Negative for hematuria.  All other systems reviewed and are negative.     Allergies  Review of patient's allergies indicates no known allergies.  Home Medications   Prior to Admission medications   Medication Sig Start Date End Date Taking? Authorizing Provider  amphetamine-dextroamphetamine (ADDERALL) 10 MG tablet Take 10 mg by mouth as needed.    Yes Historical Provider, MD  ibuprofen (ADVIL,MOTRIN) 800 MG tablet Take 1 tablet (800 mg total) by mouth every 8 (eight) hours as needed. 06/28/15  Yes Ofilia NeasMichael L Clark, PA-C  lisdexamfetamine (VYVANSE) 40 MG capsule Take 40 mg by mouth every morning.   Yes Historical Provider, MD  cephALEXin (KEFLEX) 500 MG capsule Take 2 capsules (1,000 mg total) by mouth 2 (two) times daily. 06/28/15   Ofilia NeasMichael L Clark, PA-C   BP 128/78 mmHg  Pulse 62  Temp(Src) 97.6 F (36.4 C) (Oral)  Resp 17  Ht 5\' 7"  (1.702 m)  Wt 74.844 kg  BMI 25.84 kg/m2  SpO2 100% Physical Exam  Constitutional: He appears well-developed and well-nourished. No distress.  HENT:  Head: Normocephalic and atraumatic.  Right Ear: External ear normal.  Left Ear: External ear normal.  Eyes: Conjunctivae are normal. Right eye exhibits no discharge. Left eye exhibits no discharge. No scleral icterus.  Neck: Neck supple. No tracheal deviation present.  Cardiovascular: Normal rate, regular rhythm and intact distal pulses.   Pulmonary/Chest: Effort normal and breath sounds normal. No stridor. No respiratory distress. He has no wheezes. He has no rales.  Abdominal: Soft. Bowel sounds are normal. He exhibits no distension. There is tenderness in the right lower quadrant and epigastric area. There is no rigidity, no rebound and no guarding. No hernia.  Musculoskeletal: He exhibits no edema or tenderness.  Neurological: He is alert. He has normal strength. No cranial nerve deficit (no facial droop, extraocular movements intact, no slurred speech) or  sensory deficit. He exhibits normal muscle tone. He displays no seizure activity. Coordination normal.  Skin: Skin is warm and dry. No rash noted.  Psychiatric: He has a normal mood and affect.  Nursing note and vitals reviewed.   ED Course  Procedures   Medications  sodium chloride 0.9 % bolus 1,000 mL (0 mLs Intravenous Stopped 08/03/15 1006)    And  0.9 %  sodium chloride infusion (not  administered)  HYDROmorphone (DILAUDID) injection 0.5 mg (0.5 mg Intravenous Given 08/03/15 0942)  cefTRIAXone (ROCEPHIN) 2 g in dextrose 5 % 50 mL IVPB (not administered)    And  metroNIDAZOLE (FLAGYL) IVPB 500 mg (not administered)  ondansetron (ZOFRAN) injection 4 mg (4 mg Intravenous Given 08/03/15 0903)  iopamidol (ISOVUE-300) 61 % injection (100 mLs  Contrast Given 08/03/15 0926)    Labs Review Labs Reviewed  COMPREHENSIVE METABOLIC PANEL - Abnormal; Notable for the following:    Sodium 134 (*)    Chloride 99 (*)    Glucose, Bld 128 (*)    All other components within normal limits  CBC - Abnormal; Notable for the following:    WBC 14.5 (*)    All other components within normal limits  URINALYSIS, ROUTINE W REFLEX MICROSCOPIC (NOT AT [facility]) - Abnormal; Notable for the following:    Color, Urine AMBER (*)    Ketones, ur 15 (*)    All other components within normal limits  LIPASE, BLOOD    Imaging Review Ct Abdomen Pelvis W Contrast  08/03/2015  CLINICAL DATA:  Diffuse abdominal pain and vomiting.  Hematemesis. EXAM: CT ABDOMEN AND PELVIS WITH CONTRAST TECHNIQUE: Multidetector CT imaging of the abdomen and pelvis was performed using the standard protocol following bolus administration of intravenous contrast. CONTRAST:  ISOVUE-300 IOPAMIDOL (ISOVUE-300) INJECTION 61% COMPARISON:  None. FINDINGS: Lower chest:  Normal. Hepatobiliary: Normal. Pancreas: Normal. Spleen: Normal. Adrenals/Urinary Tract: Normal. Stomach/Bowel: There is acute appendicitis. There is diffuse enlargement of the appendix with abnormal enhancement as well slight periappendiceal soft tissue inflammation. The bowel is otherwise normal. Vascular/Lymphatic: Normal. Reproductive: Normal. Other: No free air or free fluid. Musculoskeletal: Normal. IMPRESSION: Acute appendicitis. Critical Value/emergent results were called by telephone at the time of interpretation on 08/03/2015 at 10:00 am to Dr. Linwood Dibbles , who verbally  acknowledged these results. Electronically Signed   By: Francene Boyers M.D.   On: 08/03/2015 10:00   I have personally reviewed and evaluated these images and lab results as part of my medical decision-making.    MDM   Final diagnoses:  Acute appendicitis, unspecified acute appendicitis type    Patient presented to the emergency room with complaints of abdominal pain that started within the last 24 hours. The sx have increased in severity and intensity. Patient does have tenderness to palpation in the right lower quadrant. CT scan was performed to evaluate for possible acute appendicitis.  CT confirms the diagnosis. There is no evidence of perforation or abscess.  I started the patient on ceftriaxone and metronidazole. I will consult the surgical service for admission.    Linwood Dibbles, MD 08/03/15 1017

## 2015-08-03 NOTE — ED Notes (Signed)
[facility]all belongings at bedside with family.  Consent at bedside.

## 2015-08-03 NOTE — Anesthesia Postprocedure Evaluation (Signed)
Anesthesia Post Note  Patient: Victor [name]  Procedure(s) Performed: Procedure(s) (LRB): APPENDECTOMY LAPAROSCOPIC (N/A)  Patient location during evaluation: PACU Anesthesia Type: General Level of consciousness: sedated and patient cooperative Pain management: pain level controlled Vital Signs Assessment: post-procedure vital signs reviewed and stable Respiratory status: spontaneous breathing Cardiovascular status: stable Anesthetic complications: no    Last Vitals:  Filed Vitals:   08/03/15 1516 08/03/15 1617  BP:  109/56  Pulse:  49  Temp: 36.4 C 37 C  Resp:  15    Last Pain:  Filed Vitals:   08/03/15 1632  PainSc: 0-No pain                 Lewie LoronJohn Clydell Alberts

## 2015-08-03 NOTE — ED Notes (Signed)
Patient transported to CT 

## 2015-08-03 NOTE — Anesthesia Preprocedure Evaluation (Signed)
Anesthesia Evaluation  Patient identified by MRN, date of birth, ID band Patient awake    Reviewed: Allergy & Precautions, NPO status , Patient's Chart, lab work & pertinent test results  History of Anesthesia Complications Negative for: history of anesthetic complications  Airway Mallampati: II  TM Distance: >3 FB Neck ROM: Full    Dental no notable dental hx. (+) Dental Advisory Given   Pulmonary former smoker,    Pulmonary exam normal breath sounds clear to auscultation       Cardiovascular negative cardio ROS Normal cardiovascular exam Rhythm:Regular Rate:Normal     Neuro/Psych PSYCHIATRIC DISORDERS (ADHD) negative neurological ROS  negative psych ROS   GI/Hepatic negative GI ROS, Neg liver ROS,   Endo/Other  negative endocrine ROS  Renal/GU negative Renal ROS  negative genitourinary   Musculoskeletal negative musculoskeletal ROS (+)   Abdominal   Peds negative pediatric ROS (+)  Hematology negative hematology ROS (+)   Anesthesia Other Findings   Reproductive/Obstetrics negative OB ROS                             Anesthesia Physical Anesthesia Plan  ASA: II  Anesthesia Plan: General   Post-op Pain Management:    Induction: Intravenous  Airway Management Planned: Oral ETT  Additional Equipment:   Intra-op Plan:   Post-operative Plan: Extubation in OR  Informed Consent: I have reviewed the patients History and Physical, chart, labs and discussed the procedure including the risks, benefits and alternatives for the proposed anesthesia with the patient or authorized representative who has indicated his/her understanding and acceptance.   Dental advisory given  Plan Discussed with: CRNA  Anesthesia Plan Comments:         Anesthesia Quick Evaluation

## 2015-08-03 NOTE — H&P (Signed)
Victor [name] is an 25 y.o. male.   PCP:  Harle Battiest  Psychiatrist:  Dr. Stan Head  Chief Complaint: acute onset of abdominal pain, nausea and vomiting HPI: Pt presented to ED with above complaints.  He said pain started last PM around 11 PM, he went to bed but woke up with more pain that was not relived and he came to the ED this AM. He could not sleep after he woke up 2-3 AM.    Work up in the ED shows:  Afebrile, VSS.  WBC is up to 14.5, other labs are normal.  CT scan shows: There is acute appendicitis. There is diffuse enlargement of the appendix with abnormal enhancement as well slight periappendiceal soft tissue inflammation. The bowel is otherwise normal.  Past Medical History  Diagnosis Date  . ADHD (attention deficit hyperactivity disorder)     History reviewed. No pertinent past surgical history.  No family history on file. Social History:  reports that he quit smoking about 4 months ago. His smoking use included Cigarettes. He smoked 0.01 packs per day. He has never used smokeless tobacco. He reports that he drinks alcohol. He reports that he does not use illicit drugs.  Allergies: No Known Allergies  Prior to Admission medications   Medication Sig Start Date End Date Taking? Authorizing Provider  amphetamine-dextroamphetamine (ADDERALL) 10 MG tablet Take 10 mg by mouth as needed.   Yes Historical Provider, MD  ibuprofen (ADVIL,MOTRIN) 800 MG tablet Take 1 tablet (800 mg total) by mouth every 8 (eight) hours as needed. 06/28/15  Yes Tereasa Coop, PA-C  lisdexamfetamine (VYVANSE) 40 MG capsule Take 40 mg by mouth every morning.   Yes Historical Provider, MD  cephALEXin (KEFLEX) 500 MG capsule Take 2 capsules (1,000 mg total) by mouth 2 (two) times daily. 06/28/15   Tereasa Coop, PA-C     Results for orders placed or performed during the hospital encounter of 08/03/15 (from the past 48 hour(s))  Lipase, blood     Status: None   Collection Time: 08/03/15  6:23 AM   Result Value Ref Range   Lipase 17 11 - 51 U/L  Comprehensive metabolic panel     Status: Abnormal   Collection Time: 08/03/15  6:23 AM  Result Value Ref Range   Sodium 134 (L) 135 - 145 mmol/L   Potassium 4.1 3.5 - 5.1 mmol/L   Chloride 99 (L) 101 - 111 mmol/L   CO2 23 22 - 32 mmol/L   Glucose, Bld 128 (H) 65 - 99 mg/dL   BUN 12 6 - 20 mg/dL   Creatinine, Ser 0.80 0.61 - 1.24 mg/dL   Calcium 9.7 8.9 - 10.3 mg/dL   Total Protein 6.9 6.5 - 8.1 g/dL   Albumin 4.2 3.5 - 5.0 g/dL   AST 40 15 - 41 U/L   ALT 42 17 - 63 U/L   Alkaline Phosphatase 53 38 - 126 U/L   Total Bilirubin 0.8 0.3 - 1.2 mg/dL   GFR calc non Af Amer >60 >60 mL/min   GFR calc Af Amer >60 >60 mL/min    Comment: (NOTE) The eGFR has been calculated using the CKD EPI equation. This calculation has not been validated in all clinical situations. eGFR's persistently <60 mL/min signify possible Chronic Kidney Disease.    Anion gap 12 5 - 15  CBC     Status: Abnormal   Collection Time: 08/03/15  6:23 AM  Result Value Ref Range   WBC 14.5 (  H) 4.0 - 10.5 K/uL   RBC 4.82 4.22 - 5.81 MIL/uL   Hemoglobin 14.5 13.0 - 17.0 g/dL   HCT 42.1 39.0 - 52.0 %   MCV 87.3 78.0 - 100.0 fL   MCH 30.1 26.0 - 34.0 pg   MCHC 34.4 30.0 - 36.0 g/dL   RDW 12.6 11.5 - 15.5 %   Platelets 258 150 - 400 K/uL  Urinalysis, Routine w reflex microscopic     Status: Abnormal   Collection Time: 08/03/15  6:23 AM  Result Value Ref Range   Color, Urine AMBER (A) YELLOW    Comment: BIOCHEMICALS MAY BE AFFECTED BY COLOR   APPearance CLEAR CLEAR   Specific Gravity, Urine 1.028 1.005 - 1.030   pH 6.5 5.0 - 8.0   Glucose, UA NEGATIVE NEGATIVE mg/dL   Hgb urine dipstick NEGATIVE NEGATIVE   Bilirubin Urine NEGATIVE NEGATIVE   Ketones, ur 15 (A) NEGATIVE mg/dL   Protein, ur NEGATIVE NEGATIVE mg/dL   Nitrite NEGATIVE NEGATIVE   Leukocytes, UA NEGATIVE NEGATIVE    Comment: MICROSCOPIC NOT DONE ON URINES WITH NEGATIVE PROTEIN, BLOOD, LEUKOCYTES,  NITRITE, OR GLUCOSE <1000 mg/dL.   Ct Abdomen Pelvis W Contrast  08/03/2015  CLINICAL DATA:  Diffuse abdominal pain and vomiting.  Hematemesis. EXAM: CT ABDOMEN AND PELVIS WITH CONTRAST TECHNIQUE: Multidetector CT imaging of the abdomen and pelvis was performed using the standard protocol following bolus administration of intravenous contrast. CONTRAST:  162m ISOVUE-300 IOPAMIDOL (ISOVUE-300) INJECTION 61% COMPARISON:  None. FINDINGS: Lower chest:  Normal. Hepatobiliary: Normal. Pancreas: Normal. Spleen: Normal. Adrenals/Urinary Tract: Normal. Stomach/Bowel: There is acute appendicitis. There is diffuse enlargement of the appendix with abnormal enhancement as well slight periappendiceal soft tissue inflammation. The bowel is otherwise normal. Vascular/Lymphatic: Normal. Reproductive: Normal. Other: No free air or free fluid. Musculoskeletal: Normal. IMPRESSION: Acute appendicitis. Critical Value/emergent results were called by telephone at the time of interpretation on 08/03/2015 at 10:00 am to Dr. JDorie Rank, who verbally acknowledged these results. Electronically Signed   By: JLorriane ShireM.D.   On: 08/03/2015 10:00    Review of Systems  Constitutional: Positive for chills. Negative for fever.  HENT: Negative.   Respiratory: Negative.   Cardiovascular: Negative.   Gastrointestinal: Positive for heartburn (occasional), nausea, vomiting (Nausea and vomiting in the ED lobby with some blood in it.) and abdominal pain. Negative for diarrhea, constipation, blood in stool and melena.  Genitourinary: Negative.   Musculoskeletal: Positive for back pain (sore from his job).  Skin: Negative.   Neurological: Negative.   Endo/Heme/Allergies: Negative.   Psychiatric/Behavioral: Positive for depression. The patient is nervous/anxious.     Blood pressure 128/78, pulse 62, temperature 97.6 F (36.4 C), temperature source Oral, resp. rate 17, height '5\' 7"'$  (1.702 m), weight 74.844 kg (165 lb), SpO2 100  %. Physical Exam  Constitutional: He is oriented to person, place, and time. He appears well-developed and well-nourished.  HENT:  Head: Normocephalic and atraumatic.  Nose: Nose normal.  Eyes: Conjunctivae and EOM are normal. Right eye exhibits no discharge. Left eye exhibits no discharge. No scleral icterus.  Neck: Normal range of motion. Neck supple. No JVD present. No tracheal deviation present. No thyromegaly present.  Cardiovascular: Normal rate, regular rhythm, normal heart sounds and intact distal pulses.   No murmur heard. Respiratory: Effort normal and breath sounds normal. No respiratory distress. He has no wheezes. He has no rales. He exhibits no tenderness.  GI: Soft. He exhibits distension. There is tenderness (tenderness has been  in the mid upper and then the lower abdomen.  Now he is tender mid abdomen, but more so in the RLQ currently).  Musculoskeletal: He exhibits no edema.  Lymphadenopathy:    He has no cervical adenopathy.  Neurological: He is alert and oriented to person, place, and time. No cranial nerve deficit.  Skin: Skin is warm and dry. No rash noted. He is not diaphoretic. No erythema. No pallor.  Psychiatric: He has a normal mood and affect. His behavior is normal. Judgment and thought content normal.     Assessment/Plan Acute appendicitis ADHD Hx of anxiety and depression  Plan:  IV antibiotics, IV fluids and post for surgery/appendectomy today.  Risk and benefits discussed with patient and his mother.  They understand and agree.   Keshona Kartes, PA-C 08/03/2015, 10:10 AM

## 2015-08-03 NOTE — Anesthesia Procedure Notes (Addendum)
Procedure Name: Intubation Date/Time: 08/03/2015 12:19 PM Performed by: Wray KearnsFOLEY, Lillianne Eick A Pre-anesthesia Checklist: Patient identified, Emergency Drugs available, Timeout performed, Suction available and Patient being monitored Patient Re-evaluated:Patient Re-evaluated prior to inductionOxygen Delivery Method: Circle system utilized Preoxygenation: Pre-oxygenation with 100% oxygen Intubation Type: IV induction, Cricoid Pressure applied and Rapid sequence Laryngoscope Size: Mac and 4 Grade View: Grade I Tube type: Oral Tube size: 7.5 mm Number of attempts: 1 Airway Equipment and Method: Stylet Placement Confirmation: positive ETCO2,  ETT inserted through vocal cords under direct vision and breath sounds checked- equal and bilateral Secured at: 23 cm Tube secured with: Tape Dental Injury: Teeth and Oropharynx as per pre-operative assessment

## 2015-08-03 NOTE — Op Note (Signed)
Appendectomy, Lap, Procedure Note  Indications: The patient presented with a history of right-sided abdominal pain. A CT scan revealed findings consistent with acute appendicitis.  Pre-operative Diagnosis: Acute appendicitis without mention of peritonitis  Post-operative Diagnosis: Same  Surgeon: Kamauri Denardo K.   Assistants: none  Anesthesia: General endotracheal anesthesia  ASA Class: 1E  Procedure Details  The patient was seen again in the Holding Room. The risks, benefits, complications, treatment options, and expected outcomes were discussed with the patient and/or family. The possibilities of reaction to medication, perforation of viscus, bleeding, recurrent infection, finding a normal appendix, the need for additional procedures, failure to diagnose a condition, and creating a complication requiring transfusion or operation were discussed. There was concurrence with the proposed plan and informed consent was obtained. The site of surgery was properly noted. The patient was taken to Operating Room, identified as Tynell Winchell Folkshomas Dowell and the procedure verified as Appendectomy. A Time Out was held and the above information confirmed.  The patient was placed in the supine position and general anesthesia was induced.  The abdomen was prepped and draped in a sterile fashion. A one centimeter infraumbilical incision was made.  Dissection was carried down to the fascia bluntly.  The fascia was incised vertically.  We entered the peritoneal cavity bluntly.  A pursestring suture was passed around the incision with a 0 Vicryl.  The Hasson cannula was introduced into the abdomen and the tails of the suture were used to hold the Hasson in place.   The pneumoperitoneum was then established maintaining a maximum pressure of 15 mmHg.  Additional 5 mm cannulas then placed in the left lower quadrant of the abdomen and the right upper quadrant under direct visualization. A careful evaluation of the entire abdomen  was carried out. The patient was placed in Trendelenburg and left lateral decubitus position.  The scope was moved to the right upper quadrant port site. The cecum was mobilized medially.  The appendix was inflamed and thickened, but there was no sign of perforation.  The appendix was carefully dissected. The appendix was was skeletonized with the harmonic scalpel.   The appendix was divided at its base using an endo-GIA stapler. Minimal appendiceal stump was left in place. There was no evidence of bleeding, leakage, or complication after division of the appendix. Irrigation was also performed and irrigate suctioned from the abdomen as well.  The umbilical port site was closed with the purse string suture. There was no residual palpable fascial defect.  The trocar site skin wounds were closed with 4-0 Monocryl.  Instrument, sponge, and needle counts were correct at the conclusion of the case.   Findings: The appendix was found to be inflamed. There were not signs of necrosis.  There was not perforation. There was not abscess formation.  Estimated Blood Loss:  Minimal         Drains: none         Specimens: appendix         Complications:  None; patient tolerated the procedure well.         Disposition: PACU - hemodynamically stable.         Condition: stable  Wilmon ArmsMatthew K. Corliss Skainssuei, MD, [facility]  General/ Trauma Surgery  08/03/2015 1:11 PM

## 2015-08-03 NOTE — Transfer of Care (Signed)
Immediate Anesthesia Transfer of Care Note  Patient: Victor [name]  Procedure(s) Performed: Procedure(s): APPENDECTOMY LAPAROSCOPIC (N/A)  Patient Location: PACU  Anesthesia Type:General  Level of Consciousness: awake, oriented, sedated, patient cooperative and responds to stimulation  Airway & Oxygen Therapy: Patient Spontanous Breathing and Patient connected to nasal cannula oxygen  Post-op Assessment: Report given to RN, Post -op Vital signs reviewed and stable, Patient moving all extremities and Patient moving all extremities X 4  Post vital signs: Reviewed and stable  Last Vitals:  Filed Vitals:   08/03/15 1100 08/03/15 1115  BP: 118/62   Pulse: 47 56  Temp:    Resp:      Last Pain:  Filed Vitals:   08/03/15 1153  PainSc: 5          Complications: No apparent anesthesia complications

## 2015-08-03 NOTE — ED Notes (Signed)
MD at bedside. 

## 2015-08-04 ENCOUNTER — Encounter (HOSPITAL_COMMUNITY): Payer: Self-pay | Admitting: Surgery

## 2015-08-04 MED ORDER — OXYCODONE-ACETAMINOPHEN 5-325 MG PO TABS: 1.0000 | ORAL_TABLET | ORAL | Status: DC | PRN

## 2015-08-04 MED ORDER — IBUPROFEN 200 MG PO TABS: ORAL_TABLET | ORAL | Status: DC

## 2015-08-04 NOTE — Discharge Instructions (Signed)
Laparoscopic Appendectomy, Adult, Care After °Refer to this sheet in the next few weeks. These instructions provide you with information on caring for yourself after your procedure. Your caregiver may also give you more specific instructions. Your treatment has been planned according to current medical practices, but problems sometimes occur. Call your caregiver if you have any problems or questions after your procedure. °HOME CARE INSTRUCTIONS °· Do not drive while taking narcotic pain medicines. °· Use stool softener if you become constipated from your pain medicines. °· Change your bandages (dressings) as directed. °· Keep your wounds clean and dry. You may wash the wounds gently with soap and water. Gently pat the wounds dry with a clean towel. °· Do not take baths, swim, or use hot tubs for 10 days, or as instructed by your caregiver. °· Only take over-the-counter or prescription medicines for pain, discomfort, or fever as directed by your caregiver. °· You may continue your normal diet as directed. °· Do not lift more than 10 pounds (4.5 kg) or play contact sports for 3 weeks, or as directed. °· Slowly increase your activity after surgery. °· Take deep breaths to avoid getting a lung infection (pneumonia). °SEEK MEDICAL CARE IF: °· You have redness, swelling, or increasing pain in your wounds. °· You have pus coming from your wounds. °· You have drainage from a wound that lasts longer than 1 day. °· You notice a bad smell coming from the wounds or dressing. °· Your wound edges break open after stitches (sutures) have been removed. °· You notice increasing pain in the shoulders (shoulder strap areas) or near your shoulder blades. °· You develop dizzy episodes or fainting while standing. °· You develop shortness of breath. °· You develop persistent nausea or vomiting. °· You cannot control your bowel functions or lose your appetite. °· You develop diarrhea. °SEEK IMMEDIATE MEDICAL CARE IF:  °· You have a  fever. °· You develop a rash. °· You have difficulty breathing or sharp pains in your chest. °· You develop any reaction or side effects to medicines given. °MAKE SURE YOU: °· Understand these instructions. °· Will watch your condition. °· Will get help right away if you are not doing well or get worse. °  °This information is not intended to replace advice given to you by your health care provider. Make sure you discuss any questions you have with your health care provider. °  °Document Released: 03/21/2005 Document Revised: 08/05/2014 Document Reviewed: 09/08/2014 °Elsevier Interactive Patient Education ©2016 [employer]vier [employer] ° °CCS ______[facility] °LAPAROSCOPIC SURGERY: POST OP INSTRUCTIONS °Always review your discharge instruction sheet given to you by the facility where your surgery was performed. °IF YOU HAVE DISABILITY OR FAMILY LEAVE FORMS, YOU MUST BRING THEM TO THE OFFICE FOR PROCESSING.   °DO NOT GIVE THEM TO YOUR DOCTOR. ° °1. A prescription for pain medication may be given to you upon discharge.  Take your pain medication as prescribed, if needed.  If narcotic pain medicine is not needed, then you may take acetaminophen (Tylenol) or ibuprofen (Advil) as needed. °2. Take your usually prescribed medications unless otherwise directed. °3. If you need a refill on your pain medication, please contact your pharmacy.  They will contact our office to request authorization. Prescriptions will not be filled after 5pm or on week-ends. °4. You should follow a light diet the first few days after arrival home, such as soup and crackers, etc.  Be sure to include lots of fluids daily. °5. Most   patients will experience some swelling and bruising in the area of the incisions.  Ice packs will help.  Swelling and bruising can take several days to resolve.  6. It is common to experience some constipation if taking pain medication after surgery.  Increasing fluid intake and taking a stool softener (such as  Colace) will usually help or prevent this problem from occurring.  A mild laxative (Milk of Magnesia or Miralax) should be taken according to package instructions if there are no bowel movements after 48 hours. 7. Unless discharge instructions indicate otherwise, you may remove your bandages 24-48 hours after surgery, and you may shower at that time.  You may have steri-strips (small skin tapes) in place directly over the incision.  These strips should be left on the skin for 7-10 days.  If your surgeon used skin glue on the incision, you may shower in 24 hours.  The glue will flake off over the next 2-3 weeks.  Any sutures or staples will be removed at the office during your follow-up visit. 8. ACTIVITIES:  You may resume regular (light) daily activities beginning the next day--such as daily self-care, walking, climbing stairs--gradually increasing activities as tolerated.  You may have sexual intercourse when it is comfortable.  Refrain from any heavy lifting or straining until approved by your doctor. a. You may drive when you are no longer taking prescription pain medication, you can comfortably wear a seatbelt, and you can safely maneuver your car and apply brakes. b. RETURN TO WORK:  __________________________________________________________ 9. You should see your doctor in the office for a follow-up appointment approximately 2-3 weeks after your surgery.  Make sure that you call for this appointment within a day or two after you arrive home to insure a convenient appointment time. 10. OTHER INSTRUCTIONS: ________OK to go hiking as planned.  Have fun!! __________________________________________________________________________________________________________________ __________________________________________________________________________________________________________________________ WHEN TO CALL YOUR DOCTOR: 1. Fever over 101.0 2. Inability to urinate 3. Continued bleeding from  incision. 4. Increased pain, redness, or drainage from the incision. 5. Increasing abdominal pain  The clinic staff is available to answer your questions during regular business hours.  Please dont hesitate to call and ask to speak to one of the nurses for clinical concerns.  If you have a medical emergency, go to the nearest emergency room or call 911.  A surgeon from [facility] is always on call at the hospital. [address], [address], [place], [place]  [address] ? [address] [address], [place], [place]   [address] [phone] ? [phone] ? FAX [phone] Web site: [email or web address]

## 2015-08-04 NOTE — Progress Notes (Signed)
1 Day Post-Op  Subjective: He is doing well, we are going to mobilize him see how he does and aim for discharge later today.    Objective: Vital signs in last 24 hours: Temp:  [97.4 F (36.3 C)-98.6 F (37 C)] 97.7 F (36.5 C) (05/02 0556) Pulse Rate:  [46-70] 60 (05/02 0556) Resp:  [9-18] 18 (05/02 0556) BP: (97-128)/(46-86) 102/46 mmHg (05/02 0556) SpO2:  [93 %-100 %] 98 % (05/02 0556) Weight:  [74.8 kg (164 lb 14.5 oz)-78.563 kg (173 lb 3.2 oz)] 78.563 kg (173 lb 3.2 oz) (05/01 1617) Last BM Date: 08/01/15 480 PO Urine 2100 Afebrile, VSS No labs  Intake/Output from previous day: 05/01 0701 - 05/02 0700 In: 2654.2 [P.O.:480; I.V.:2174.2] Out: 2110 [Urine:2100; Blood:10] Intake/Output this shift:    General appearance: alert, cooperative and no distress Resp: clear to auscultation bilaterally GI: soft, sore, sites look fine,   Lab Results:   Recent Labs  08/03/15 0623  WBC 14.5*  HGB 14.5  HCT 42.1  PLT 258    BMET  Recent Labs  08/03/15 0623  NA 134*  K 4.1  CL 99*  CO2 23  GLUCOSE 128*  BUN 12  CREATININE 0.80  CALCIUM 9.7   PT/INR No results for input(s): LABPROT, INR in the last 72 hours.   Recent Labs Lab 08/03/15 0623  AST 40  ALT 42  ALKPHOS 53  BILITOT 0.8  PROT 6.9  ALBUMIN 4.2     Lipase     Component Value Date/Time   LIPASE 17 08/03/2015 0623     Studies/Results: Ct Abdomen Pelvis W Contrast  08/03/2015  CLINICAL DATA:  Diffuse abdominal pain and vomiting.  Hematemesis. EXAM: CT ABDOMEN AND PELVIS WITH CONTRAST TECHNIQUE: Multidetector CT imaging of the abdomen and pelvis was performed using the standard protocol following bolus administration of intravenous contrast. CONTRAST:  100mL ISOVUE-300 IOPAMIDOL (ISOVUE-300) INJECTION 61% COMPARISON:  None. FINDINGS: Lower chest:  Normal. Hepatobiliary: Normal. Pancreas: Normal. Spleen: Normal. Adrenals/Urinary Tract: Normal. Stomach/Bowel: There is acute appendicitis. There is  diffuse enlargement of the appendix with abnormal enhancement as well slight periappendiceal soft tissue inflammation. The bowel is otherwise normal. Vascular/Lymphatic: Normal. Reproductive: Normal. Other: No free air or free fluid. Musculoskeletal: Normal. IMPRESSION: Acute appendicitis. Critical Value/emergent results were called by telephone at the time of interpretation on 08/03/2015 at 10:00 am to Dr. Linwood DibblesJON KNAPP , who verbally acknowledged these results. Electronically Signed   By: Francene BoyersJames  Maxwell M.D.   On: 08/03/2015 10:00    Medications: . cefTRIAXone (ROCEPHIN)  IV  2 g Intravenous Q24H   And  . metronidazole  500 mg Intravenous Q8H  . enoxaparin (LOVENOX) injection  40 mg Subcutaneous Q24H  . ketorolac  30 mg Intravenous Q6H  . lisdexamfetamine  40 mg Oral q morning - 10a   . sodium chloride 50 mL/hr at 08/03/15 1631   Assessment/Plan Acute appendicitis S/p laparoscopic appendectomy 08/03/15, Dr. Corliss Skainssuei ADHD Hx of anxiety and depression FEN:  IV fluids/regular diet ID: day 2 rocephin/flagyl IV VTE:  Lovenox/SCD    Plan:  Home later today, he can go hiking when he is ready per Dr. Corliss Skainssuei. I told him if he was going to be on the trail for his appointment to call the office and cancel current appointment.        Victor [name],Victor [name] 08/04/2015 [phone]

## 2015-08-05 NOTE — Discharge Summary (Signed)
Physician Discharge Summary  Patient ID: Victor [name] MRN: [id number] DOB/AGE: 1990/05/22 25 y.o.  Admit date: 08/03/2015 Discharge date: 08/04/2015    Admission Diagnoses:  Acute appendicitis ADHD Hx of anxiety and depression   Discharge Diagnoses:  Acute appendicitis ADHD Hx of anxiety and depression   Active Problems:   Acute appendicitis   PROCEDURES: S/p laparoscopic appendectomy 08/03/15, Dr. Orpah Clinton Course:  Chief Complaint: acute onset of abdominal pain, nausea and vomiting HPI: Pt presented to ED with above complaints. He said pain started last PM around 11 PM, he went to bed but woke up with more pain that was not relived and he came to the ED this AM. He could not sleep after he woke up 2-3 AM.   Work up in the ED shows: Afebrile, VSS. WBC is up to 14.5, other labs are normal. CT scan shows: There is acute appendicitis. There is diffuse enlargement of the appendix with abnormal enhancement as well slight periappendiceal soft tissue inflammation. The bowel is otherwise normal.  He was seen in the ED and taken to the OR later that day.  He did well post op and by lunch time the 1st post op day he was ready for discharge.  He is planning a long hiking [place], later this month and Dr. Corliss Skains said he could continue with his current plans.     Condition on d/c:  Improved  CBC Latest Ref Rng 08/03/2015  WBC 4.0 - 10.5 K/uL 14.5(H)  Hemoglobin 13.0 - 17.0 g/dL 40.9  Hematocrit 81.1 - 52.0 % 42.1  Platelets 150 - 400 K/uL 258   CMP Latest Ref Rng 08/03/2015  Glucose 65 - 99 mg/dL 914(N)  BUN 6 - 20 mg/dL 12  Creatinine 8.29 - 5.62 mg/dL 1.30  Sodium 865 - 784 mmol/L 134(L)  Potassium 3.5 - 5.1 mmol/L 4.1  Chloride 101 - 111 mmol/L 99(L)  CO2 22 - 32 mmol/L 23  Calcium 8.9 - 10.3 mg/dL 9.7  Total Protein 6.5 - 8.1 g/dL 6.9  Total Bilirubin 0.3 - 1.2 mg/dL 0.8  Alkaline Phos 38 - 126 U/L 53  AST 15 - 41 U/L 40  ALT 17 - 63 U/L 42    Disposition: 01-Home or Self Care     Medication List    TAKE these medications        amphetamine-dextroamphetamine 10 MG tablet  Commonly known as:  ADDERALL  Take 10 mg by mouth as needed.     cephALEXin 500 MG capsule  Commonly known as:  KEFLEX  Take 2 capsules (1,000 mg total) by mouth 2 (two) times daily.     ibuprofen 200 MG tablet  Commonly known as:  ADVIL,MOTRIN  You can take 2-3 safely every 6 hours as needed for pain.     oxyCODONE-acetaminophen 5-325 MG tablet  Commonly known as:  PERCOCET/ROXICET  Take 1-2 tablets by mouth every 4 (four) hours as needed for moderate pain.     VYVANSE 40 MG capsule  Generic drug:  lisdexamfetamine  Take 40 mg by mouth every morning.           Follow-up Information    Follow up with [facility] On 08/26/2015.   Specialty:  General Surgery   Why:  Your appointment is at 9:30 AM, be at the office 30 minute early for check in.   Contact information:   [address] [place] [address] [phone]  SignedSherrie George: Tranice Laduke 08/05/2015, 4:15 PM

## 2016-05-20 ENCOUNTER — Emergency Department (HOSPITAL_COMMUNITY): Payer: BLUE CROSS/BLUE SHIELD

## 2016-05-20 ENCOUNTER — Emergency Department (HOSPITAL_COMMUNITY)
Admission: EM | Admit: 2016-05-20 | Discharge: 2016-05-21 | Disposition: A | Discharge status: Home / Self Care | Payer: BLUE CROSS/BLUE SHIELD | Attending: Emergency Medicine | Admitting: Emergency Medicine

## 2016-05-20 ENCOUNTER — Encounter (HOSPITAL_COMMUNITY): Payer: Self-pay | Admitting: *Deleted

## 2016-05-20 DIAGNOSIS — Y939 Activity, unspecified: Secondary | ICD-10-CM | POA: Diagnosis not present

## 2016-05-20 DIAGNOSIS — S51812A Laceration without foreign body of left forearm, initial encounter: Secondary | ICD-10-CM | POA: Diagnosis present

## 2016-05-20 DIAGNOSIS — Z23 Encounter for immunization: Secondary | ICD-10-CM | POA: Diagnosis not present

## 2016-05-20 DIAGNOSIS — Y929 Unspecified place or not applicable: Secondary | ICD-10-CM | POA: Insufficient documentation

## 2016-05-20 DIAGNOSIS — W268XXA Contact with other sharp object(s), not elsewhere classified, initial encounter: Secondary | ICD-10-CM | POA: Diagnosis not present

## 2016-05-20 DIAGNOSIS — M795 Residual foreign body in soft tissue: Secondary | ICD-10-CM

## 2016-05-20 DIAGNOSIS — Z87891 Personal history of nicotine dependence: Secondary | ICD-10-CM | POA: Diagnosis not present

## 2016-05-20 DIAGNOSIS — F909 Attention-deficit hyperactivity disorder, unspecified type: Secondary | ICD-10-CM | POA: Insufficient documentation

## 2016-05-20 DIAGNOSIS — Z79899 Other long term (current) drug therapy: Secondary | ICD-10-CM | POA: Insufficient documentation

## 2016-05-20 DIAGNOSIS — Y990 Civilian activity done for income or pay: Secondary | ICD-10-CM | POA: Diagnosis not present

## 2016-05-20 MED ORDER — TETANUS-DIPHTH-ACELL PERTUSSIS 5-2.5-18.5 LF-MCG/0.5 IM SUSP
0.5000 mL | Freq: Once | INTRAMUSCULAR | Status: AC
  Administered 2016-05-20: 0.5 mL via INTRAMUSCULAR
  Filled 2016-05-20: qty 0.5

## 2016-05-20 MED ORDER — LIDOCAINE-EPINEPHRINE (PF) 2 %-1:200000 IJ SOLN
10.0000 mL | Freq: Once | INTRAMUSCULAR | Status: AC
  Administered 2016-05-20: 10 mL
  Filled 2016-05-20: qty 20

## 2016-05-20 NOTE — ED Triage Notes (Signed)
Pt reports he was working on his car today, his L arm slipped cutting his L medial wrist on a sharp metal on his car.  Adipose tissues noted, no active bleeding noted at this time.

## 2016-05-21 DIAGNOSIS — S51812A Laceration without foreign body of left forearm, initial encounter: Secondary | ICD-10-CM | POA: Diagnosis not present

## 2016-05-21 MED ORDER — SULFAMETHOXAZOLE-TRIMETHOPRIM 800-160 MG PO TABS: 1.0000 | ORAL_TABLET | Freq: Two times a day (BID) | ORAL | 0 refills | Status: AC

## 2016-05-21 MED ORDER — BACITRACIN ZINC 500 UNIT/GM EX OINT
TOPICAL_OINTMENT | Freq: Two times a day (BID) | CUTANEOUS | Status: DC
  Administered 2016-05-21: 1 via TOPICAL
  Filled 2016-05-21: qty 0.9

## 2016-05-21 MED ORDER — BACITRACIN ZINC 500 UNIT/GM EX OINT: 1.0000 "application " | TOPICAL_OINTMENT | Freq: Two times a day (BID) | CUTANEOUS | 0 refills | Status: DC

## 2016-05-21 MED ORDER — SULFAMETHOXAZOLE-TRIMETHOPRIM 800-160 MG PO TABS
1.0000 | ORAL_TABLET | Freq: Once | ORAL | Status: AC
  Administered 2016-05-21: 1 via ORAL
  Filled 2016-05-21: qty 1

## 2016-05-21 NOTE — ED Provider Notes (Signed)
[facility]-EMERGENCY DEPT Provider Note   CSN: [id number] Arrival date & time: 05/20/16  1831     History   Chief Complaint Chief Complaint  Patient presents with  . Laceration    HPI Victor [name] is a 26 y.o. male.  HPI  SUBJECTIVE:  26 y.o. male sustained laceration of arm 4 hours ago. Ashby Dawes of injury: cut over part of a car, while he was working on it. Tetanus vaccination status reviewed: tetanus status unknown to the patient. Pt has no associated numbness, tingling.  Past Medical History:  Diagnosis Date  . ADHD (attention deficit hyperactivity disorder)     Patient Active Problem List   Diagnosis Date Noted  . Acute appendicitis 08/03/2015  . Attention deficit hyperactivity disorder (ADHD) 01/23/2015    Past Surgical History:  Procedure Laterality Date  . LAPAROSCOPIC APPENDECTOMY N/A 08/03/2015   Procedure: APPENDECTOMY LAPAROSCOPIC;  Surgeon: Manus Rudd, MD;  Location: [facility] OR;  Service: General;  Laterality: N/A;       Home Medications    Prior to Admission medications   Medication Sig Start Date End Date Taking? Authorizing Provider  amphetamine-dextroamphetamine (ADDERALL) 10 MG tablet Take 10 mg by mouth as needed.    Historical Provider, MD  bacitracin ointment Apply 1 application topically 2 (two) times daily. 05/21/16   Derwood Kaplan, MD  cephALEXin (KEFLEX) 500 MG capsule Take 2 capsules (1,000 mg total) by mouth 2 (two) times daily. 06/28/15   [name], PA-C  ibuprofen (ADVIL,MOTRIN) 200 MG tablet You can take 2-3 safely every 6 hours as needed for pain. 08/04/15   Sherrie George, PA-C  lisdexamfetamine (VYVANSE) 40 MG capsule Take 40 mg by mouth every morning.    Historical Provider, MD  oxyCODONE-acetaminophen (PERCOCET/ROXICET) 5-325 MG tablet Take 1-2 tablets by mouth every 4 (four) hours as needed for moderate pain. 08/04/15   Sherrie George, PA-C  sulfamethoxazole-trimethoprim (BACTRIM DS,SEPTRA DS) 800-160 MG tablet Take 1 tablet by mouth 2  (two) times daily. 05/21/16 05/28/16  Derwood Kaplan, MD    Family History No family history on file.  Social History Social History  Substance Use Topics  . Smoking status: Former Smoker    Packs/day: 0.01    Types: Cigarettes    Quit date: 04/01/2015  . Smokeless tobacco: Never Used  . Alcohol use 0.0 oz/week     Allergies   Patient has no known allergies.   Review of Systems Review of Systems  Skin: Positive for wound.  Allergic/Immunologic: Negative for immunocompromised state.  Neurological: Negative for numbness.  Hematological: Does not bruise/bleed easily.      Physical Exam Updated Vital Signs BP 144/70 (BP Location: Right Arm)   Pulse 87   Temp 98.6 F (37 C) (Oral)   Resp 18   Ht 5\' 8"  (1.727 m)   Wt 165 lb (74.8 kg)   SpO2 100%   BMI 25.09 kg/m   Physical Exam  Constitutional: He is oriented to person, place, and time. He appears well-developed.  HENT:  Head: Atraumatic.  Neck: Neck supple.  Cardiovascular: Normal rate.   Pulmonary/Chest: Effort normal.  Neurological: He is alert and oriented to person, place, and time.  Skin: Skin is warm.  5 cm laceration to the L forearm  Nursing note and vitals reviewed.    ED Treatments / Results  Labs (all labs ordered are listed, but only abnormal results are displayed) Labs Reviewed - No data to display  EKG  EKG Interpretation None  Radiology Dg Wrist 2 Views Left  Result Date: 05/20/2016 CLINICAL DATA:  Laceration to the left medial wrist while working on car. Initial encounter. EXAM: LEFT WRIST - 2 VIEW COMPARISON:  None. FINDINGS: There is no evidence of fracture or dislocation. The carpal rows are intact, and demonstrate normal alignment. The joint spaces are preserved. A soft tissue laceration is along the along the volar aspect of the distal forearm. No radiopaque foreign bodies are seen. IMPRESSION: 1. No evidence of fracture or dislocation. 2. No radiopaque foreign bodies seen.  Electronically Signed   By: Roanna RaiderJeffery  Chang M.D.   On: 05/20/2016 23:57    Procedures Procedures (including critical care time)  LACERATION REPAIR Performed by: Derwood KaplanNanavati, Tiffane Sheldon Authorized by: Derwood KaplanNanavati, Stevee Valenta Consent: Verbal consent obtained. Risks and benefits: risks, benefits and alternatives were discussed Consent given by: patient Patient identity confirmed: provided demographic data Prepped and Draped in normal sterile fashion Wound explored  Laceration Location: L forearm  Laceration Length: 5 cm  No Foreign Bodies seen or palpated  Anesthesia: local infiltration  Local anesthetic: lidocaine 1 % with epinephrine  Anesthetic total: 3 ml  Irrigation method: syringe Amount of cleaning: standard  Skin closure: primary  Number of sutures: 8  Technique: simple inturrupted  Patient tolerance: Patient tolerated the procedure well with no immediate complications.    Medications Ordered in ED Medications  bacitracin ointment (not administered)  sulfamethoxazole-trimethoprim (BACTRIM DS,SEPTRA DS) 800-160 MG per tablet 1 tablet (not administered)  lidocaine-EPINEPHrine (XYLOCAINE W/EPI) 2 %-1:200000 (PF) injection 10 mL (10 mLs Infiltration Given 05/20/16 2358)  Tdap (BOOSTRIX) injection 0.5 mL (0.5 mLs Intramuscular Given 05/20/16 2357)     Initial Impression / Assessment and Plan / ED Course  I have reviewed the triage vital signs and the nursing notes.  Pertinent labs & imaging results that were available during my care of the patient were reviewed by me and considered in my medical decision making (see chart for details).    OBJECTIVE:  Patient appears well, vitals are normal. Laceration 5 cm noted.  Description: contaminated. Neurovascular and tendon structures are intact.  ASSESSMENT:  Laceration as described.  PLAN:  Anesthesia with 1% Lidocaine with Epinephrine. Wound cleansed, debrided of visible foreign material and necrotic tissue, and sutured.  Antibiotic ointment and dressing applied.  Wound care instructions provided.  Observe for any signs of infection or other problems.  Return for suture removal in 7 days.   Final Clinical Impressions(s) / ED Diagnoses   Final diagnoses:  Laceration of left forearm, initial encounter    New Prescriptions New Prescriptions   BACITRACIN OINTMENT    Apply 1 application topically 2 (two) times daily.   SULFAMETHOXAZOLE-TRIMETHOPRIM (BACTRIM DS,SEPTRA DS) 800-160 MG TABLET    Take 1 tablet by mouth 2 (two) times daily.     Derwood KaplanAnkit Stephanny Tsutsui, MD 05/21/16 [phone]

## 2016-05-21 NOTE — Discharge Instructions (Signed)
We saw you in the ER for your WOUND. °Please read the instructions provided on wound care. °Keep the area clean and dry, apply bacitracin ointment daily and take the medications provided. °RETURN TO THE ER IF THERE IS INCREASED PAIN, REDNESS, PUS COMING OUT from the wound site.  °

## 2016-11-05 DIAGNOSIS — S30860A Insect bite (nonvenomous) of lower back and pelvis, initial encounter: Secondary | ICD-10-CM | POA: Diagnosis not present

## 2016-11-05 DIAGNOSIS — W57XXXA Bitten or stung by nonvenomous insect and other nonvenomous arthropods, initial encounter: Secondary | ICD-10-CM | POA: Diagnosis not present

## 2017-05-01 DIAGNOSIS — M25569 Pain in unspecified knee: Secondary | ICD-10-CM | POA: Diagnosis not present

## 2017-05-16 DIAGNOSIS — S01511A Laceration without foreign body of lip, initial encounter: Secondary | ICD-10-CM | POA: Diagnosis not present

## 2017-05-19 DIAGNOSIS — M25561 Pain in right knee: Secondary | ICD-10-CM | POA: Diagnosis not present

## 2017-05-26 DIAGNOSIS — M25561 Pain in right knee: Secondary | ICD-10-CM | POA: Diagnosis not present

## 2017-05-26 DIAGNOSIS — M25562 Pain in left knee: Secondary | ICD-10-CM | POA: Diagnosis not present

## 2017-05-26 DIAGNOSIS — M2241 Chondromalacia patellae, right knee: Secondary | ICD-10-CM | POA: Diagnosis not present

## 2017-06-16 DIAGNOSIS — M25561 Pain in right knee: Secondary | ICD-10-CM | POA: Diagnosis not present

## 2017-06-16 DIAGNOSIS — M2241 Chondromalacia patellae, right knee: Secondary | ICD-10-CM | POA: Diagnosis not present

## 2017-06-16 DIAGNOSIS — M25562 Pain in left knee: Secondary | ICD-10-CM | POA: Diagnosis not present

## 2017-06-23 DIAGNOSIS — M2241 Chondromalacia patellae, right knee: Secondary | ICD-10-CM | POA: Diagnosis not present

## 2017-06-23 DIAGNOSIS — M25561 Pain in right knee: Secondary | ICD-10-CM | POA: Diagnosis not present

## 2017-06-23 DIAGNOSIS — M25562 Pain in left knee: Secondary | ICD-10-CM | POA: Diagnosis not present

## 2017-07-31 DIAGNOSIS — Z0000 Encounter for general adult medical examination without abnormal findings: Secondary | ICD-10-CM | POA: Diagnosis not present

## 2017-08-04 DIAGNOSIS — M25561 Pain in right knee: Secondary | ICD-10-CM | POA: Diagnosis not present

## 2018-01-26 ENCOUNTER — Ambulatory Visit: Payer: Self-pay | Admitting: Psychiatry

## 2018-02-09 ENCOUNTER — Ambulatory Visit: Payer: Self-pay | Admitting: Psychiatry

## 2018-02-26 ENCOUNTER — Ambulatory Visit: Payer: Commercial Managed Care - PPO | Admitting: Psychiatry

## 2018-02-26 DIAGNOSIS — F411 Generalized anxiety disorder: Secondary | ICD-10-CM

## 2018-02-26 DIAGNOSIS — F909 Attention-deficit hyperactivity disorder, unspecified type: Secondary | ICD-10-CM

## 2018-02-26 MED ORDER — GABAPENTIN 100 MG PO CAPS: ORAL_CAPSULE | ORAL | 1 refills | Status: DC

## 2018-02-26 MED ORDER — LISDEXAMFETAMINE DIMESYLATE 40 MG PO CAPS: 40.0000 mg | ORAL_CAPSULE | Freq: Every morning | ORAL | 0 refills | Status: DC

## 2018-02-26 NOTE — Progress Notes (Signed)
[facility] Check  Patient ID: Victor [name],  MRN: [id number]  PCP: Patient, No Pcp Per  Date of Evaluation: 02/26/2018 Time spent:20 minutes  Chief Complaint:   HISTORY/CURRENT STATUS: HPI seen May of this year.  Well at that time.  No change in medication. No side effects. Diagnosis is ADHD. Currently adhd good.  Today reports anxiety for several months. Has been in [place] for work for 2-3 months. Having panic attacks. Some anxiety in past.  Denies depression.  States up moods for 3 months. Last 1 hr. Not impulsive or grandiose. Some increase in talking. No decreased sleep. Goal oriented.   Individual Medical History/ Review of Systems: Changes? :No   Allergies: Patient has no known allergies.  Current Medications:  Current Outpatient Medications:  .  amphetamine-dextroamphetamine (ADDERALL) 10 MG tablet, Take 10 mg by mouth as needed., Disp: , Rfl:  .  ibuprofen (ADVIL,MOTRIN) 200 MG tablet, You can take 2-3 safely every 6 hours as needed for pain., Disp: , Rfl:  .  lisdexamfetamine (VYVANSE) 40 MG capsule, Take 40 mg by mouth every morning., Disp: , Rfl:  .  bacitracin ointment, Apply 1 application topically 2 (two) times daily. (Patient not taking: Reported on 02/26/2018), Disp: 28 g, Rfl: 0 .  cephALEXin (KEFLEX) 500 MG capsule, Take 2 capsules (1,000 mg total) by mouth 2 (two) times daily. (Patient not taking: Reported on 02/26/2018), Disp: 40 capsule, Rfl: 0 .  oxyCODONE-acetaminophen (PERCOCET/ROXICET) 5-325 MG tablet, Take 1-2 tablets by mouth every 4 (four) hours as needed for moderate pain. (Patient not taking: Reported on 02/26/2018), Disp: 40 tablet, Rfl: 0 Medication Side Effects: none  Family Medical/ Social History: Changes? No  MENTAL HEALTH EXAM:  There were no vitals taken for this visit.There is no height or weight on file to calculate BMI.  General Appearance: Casual  Eye Contact:  Good  Speech:  Normal Rate  Volume:  Normal  Mood:  Euthymic   Affect:  Appropriate  Thought Process:  Goal Directed  Orientation:  Full (Time, Place, and Person)  Thought Content: Logical   Suicidal Thoughts:  No  Homicidal Thoughts:  No  Memory:  WNL  Judgement:  Good  Insight:  Good  Psychomotor Activity:  Normal  Concentration:  Concentration: Good  Recall:  Good  Fund of Knowledge: Good  Language: Good  Assets:  Desire for Improvement  ADL's:  Intact  Cognition: WNL  Prognosis:  Good  bp 160/90 pulse 113.  DIAGNOSES:    ICD-10-CM   1. Attention deficit hyperactivity disorder (ADHD), unspecified ADHD type F90.9   2. Anxiety state F41.1    elevated bp and pulse  Receiving Psychotherapy: No     RECOMMENDATIONS: pt willing to stop adderall. Will continue vyvanse 40/day. Pt to check bpand pulse 3x/wk. Pt to call if vs remain elevated. Start gabapentin 100 1-2 prn panic attack. Limit 6 in 1 day.Pt doesn't want to start an ssri for anxiety. Return in 1 month.   Anne Fulay Bunny Lowdermilk, PA-C

## 2018-03-08 ENCOUNTER — Ambulatory Visit: Payer: Self-pay | Admitting: Psychiatry

## 2018-03-20 ENCOUNTER — Ambulatory Visit: Payer: Commercial Managed Care - PPO | Admitting: Psychiatry

## 2018-03-20 DIAGNOSIS — F411 Generalized anxiety disorder: Secondary | ICD-10-CM

## 2018-03-20 DIAGNOSIS — F909 Attention-deficit hyperactivity disorder, unspecified type: Secondary | ICD-10-CM

## 2018-03-20 MED ORDER — GABAPENTIN 100 MG PO CAPS: ORAL_CAPSULE | ORAL | 1 refills | Status: DC

## 2018-03-20 MED ORDER — AMPHETAMINE-DEXTROAMPHET ER 15 MG PO CP24: 15.0000 mg | ORAL_CAPSULE | ORAL | 0 refills | Status: DC

## 2018-03-20 NOTE — Progress Notes (Addendum)
Pt to wait to start adderall until after sees PCP.     [facility]  Patient ID: Victor [name],  MRN: [id number]  PCP: Patient, No Pcp Per  Date of Evaluation: 03/20/2018 Time spent:20 minutes  Chief Complaint:   HISTORY/CURRENT STATUS: HPI patient seen 02/26/2018.  ADHD was doing well he was having more anxiety with panic attacks.  Started him on gabapentin for the anxiety.  He did not want to start an SSRI.  I had him stop his daytime Adderall.  He was to continue the Vyvanse 40 mg a day.  Elevated blood pressure 160/90 with a pulse of 113 patient was to check his pulse periodically. Anxiety better with gabapentin prn adhd good.   Individual Medical History/ Review of Systems: Changes? :Yes  gives hx of several weeks worth of left sternal c/p with heart racing, fluttering, SOB. Daily and lasts several hours. Doesn;t only occur with stress.  Allergies: Patient has no known allergies.  Current Medications:  Current Outpatient Medications:  .  gabapentin (NEURONTIN) 100 MG capsule, 1-2 tabs prn anxiety. Can take up to 6 tabs/day., Disp: 120 capsule, Rfl: 1 .  amphetamine-dextroamphetamine (ADDERALL XR) 15 MG 24 hr capsule, Take 1 capsule by mouth every morning., Disp: 30 capsule, Rfl: 0 .  bacitracin ointment, Apply 1 application topically 2 (two) times daily. (Patient not taking: Reported on 02/26/2018), Disp: 28 g, Rfl: 0 .  cephALEXin (KEFLEX) 500 MG capsule, Take 2 capsules (1,000 mg total) by mouth 2 (two) times daily. (Patient not taking: Reported on 02/26/2018), Disp: 40 capsule, Rfl: 0 .  ibuprofen (ADVIL,MOTRIN) 200 MG tablet, You can take 2-3 safely every 6 hours as needed for pain., Disp: , Rfl:  .  oxyCODONE-acetaminophen (PERCOCET/ROXICET) 5-325 MG tablet, Take 1-2 tablets by mouth every 4 (four) hours as needed for moderate pain. (Patient not taking: Reported on 02/26/2018), Disp: 40 tablet, Rfl: 0 Medication Side Effects: chest tightness with increased bp  Family  Medical/ Social History: Changes? no  MENTAL HEALTH EXAM:  There were no vitals taken for this visit.There is no height or weight on file to calculate BMI.  General Appearance: Casual  Eye Contact:  Good  Speech:  Normal Rate  Volume:  Normal  Mood:  Euthymic  Affect:  Appropriate  Thought Process:  Linear  Orientation:  Full (Time, Place, and Person)  Thought Content: Logical   Suicidal Thoughts:  No  Homicidal Thoughts:  No  Memory:  WNL  Judgement:  Good  Insight:  Good  Psychomotor Activity:  Normal  Concentration:  Concentration: Good  Recall:  Good  Fund of Knowledge: Good  Language: Good  Assets:  Desire for Improvement  ADL's:  Intact  Cognition: WNL  Prognosis:  Good    DIAGNOSES:    ICD-10-CM   1. Attention deficit hyperactivity disorder (ADHD), unspecified ADHD type F90.9   2. Anxiety state F41.1     Receiving Psychotherapy: No    RECOMMENDATIONS: Patient is to call his family doctor today and given blood pressure readings.  Blood pressure today is 151/104 with a pulse of 104.  Blood pressure at home patient states was less than 140/90 please only check it several times.  Blood pressure at his last visit was 160/90 with a pulse of 113 that was 02/26/2018.  Patient wishes to discontinue his Vyvanse which is done.  He was placed on Adderall XR 15 mg 1 a day.  Return in 1 month  [address] Happel, [place]

## 2018-03-21 DIAGNOSIS — R030 Elevated blood-pressure reading, without diagnosis of hypertension: Secondary | ICD-10-CM | POA: Diagnosis not present

## 2018-03-21 DIAGNOSIS — R0789 Other chest pain: Secondary | ICD-10-CM | POA: Diagnosis not present

## 2018-04-17 ENCOUNTER — Ambulatory Visit: Payer: Commercial Managed Care - PPO | Admitting: Psychiatry

## 2018-04-23 ENCOUNTER — Ambulatory Visit: Payer: Commercial Managed Care - PPO | Admitting: Psychiatry

## 2018-04-23 DIAGNOSIS — F909 Attention-deficit hyperactivity disorder, unspecified type: Secondary | ICD-10-CM

## 2018-04-23 MED ORDER — ALPRAZOLAM 0.5 MG PO TABS: ORAL_TABLET | ORAL | 1 refills | Status: DC

## 2018-04-23 MED ORDER — AMPHETAMINE-DEXTROAMPHET ER 25 MG PO CP24: 25.0000 mg | ORAL_CAPSULE | ORAL | 0 refills | Status: DC

## 2018-04-23 MED ORDER — AMPHETAMINE-DEXTROAMPHETAMINE 10 MG PO TABS: ORAL_TABLET | ORAL | 0 refills | Status: DC

## 2018-04-23 NOTE — Progress Notes (Signed)
[facility]  Patient ID: Victor [name],  MRN: [id number]  PCP: Patient, No Pcp Per  Date of Evaluation: 04/23/2018 Time spent:20 minutes  Chief Complaint:   HISTORY/CURRENT STATUS: HPI ordered patient on Adderall last visit.  He is 15 mg a day.  He noticed no benefit at all.  He has not checked his blood pressure at home.  I checked it today was 162/97 with a pulse of 99 and he has not taken a stimulant today.  Individual Medical History/ Review of Systems: Changes? :No   Allergies: Patient has no known allergies.  Current Medications:  Current Outpatient Medications:  .  amphetamine-dextroamphetamine (ADDERALL XR) 15 MG 24 hr capsule, Take 1 capsule by mouth every morning., Disp: 30 capsule, Rfl: 0 .  bacitracin ointment, Apply 1 application topically 2 (two) times daily. (Patient not taking: Reported on 02/26/2018), Disp: 28 g, Rfl: 0 .  cephALEXin (KEFLEX) 500 MG capsule, Take 2 capsules (1,000 mg total) by mouth 2 (two) times daily. (Patient not taking: Reported on 02/26/2018), Disp: 40 capsule, Rfl: 0 .  gabapentin (NEURONTIN) 100 MG capsule, 1-2 tabs prn anxiety. Can take up to 6 tabs/day., Disp: 120 capsule, Rfl: 1 .  ibuprofen (ADVIL,MOTRIN) 200 MG tablet, You can take 2-3 safely every 6 hours as needed for pain., Disp: , Rfl:  .  oxyCODONE-acetaminophen (PERCOCET/ROXICET) 5-325 MG tablet, Take 1-2 tablets by mouth every 4 (four) hours as needed for moderate pain. (Patient not taking: Reported on 02/26/2018), Disp: 40 tablet, Rfl: 0 Medication Side Effects: none  Family Medical/ Social History: Changes? No  MENTAL HEALTH EXAM:  There were no vitals taken for this visit.There is no height or weight on file to calculate BMI.  General Appearance: Casual  Eye Contact:  Good  Speech:  Normal Rate  Volume:  Normal  Mood:  Euthymic  Affect:  Appropriate  Thought Process:  Linear  Orientation:  Full (Time, Place, and Person)  Thought Content: Logical   Suicidal  Thoughts:  No  Homicidal Thoughts:  No  Memory:  WNL  Judgement:  Good  Insight:  Good  Psychomotor Activity:  Normal  Concentration:  Concentration: Good  Recall:  Good  Fund of Knowledge: Good  Language: Good  Assets:  Desire for Improvement  ADL's:  Intact  Cognition: WNL  Prognosis:  Good    DIAGNOSES: No diagnosis found.  Receiving Psychotherapy: No    RECOMMENDATIONS: As patient's blood pressure today was still elevated is not the Adderall that is causing him to have blood pressure issues.  I have given him the last 3 blood pressure readings and he is to take it to his family physician.  We are going to increase his Adderall XR to 25 mg a day and start Adderall plain 10 mg 1/2 to 1 tablet at 2 in the afternoon.  He also states that he will check his blood pressure and record it for [place].  Next visit is 1 month   [employer], [address]

## 2018-05-16 ENCOUNTER — Other Ambulatory Visit: Payer: Self-pay

## 2018-05-16 ENCOUNTER — Emergency Department (HOSPITAL_COMMUNITY)
Admission: EM | Admit: 2018-05-16 | Discharge: 2018-05-16 | Disposition: A | Discharge status: Home / Self Care | Payer: Commercial Managed Care - PPO | Attending: Emergency Medicine | Admitting: Emergency Medicine

## 2018-05-16 ENCOUNTER — Encounter (HOSPITAL_COMMUNITY): Payer: Self-pay | Admitting: Emergency Medicine

## 2018-05-16 ENCOUNTER — Ambulatory Visit (HOSPITAL_COMMUNITY)
Admission: EM | Admit: 2018-05-16 | Discharge: 2018-05-16 | Disposition: A | Discharge status: Home / Self Care | Payer: Commercial Managed Care - PPO | Source: Home / Self Care

## 2018-05-16 ENCOUNTER — Encounter (HOSPITAL_COMMUNITY): Payer: Self-pay | Admitting: *Deleted

## 2018-05-16 DIAGNOSIS — F17210 Nicotine dependence, cigarettes, uncomplicated: Secondary | ICD-10-CM | POA: Insufficient documentation

## 2018-05-16 DIAGNOSIS — R945 Abnormal results of liver function studies: Secondary | ICD-10-CM | POA: Diagnosis not present

## 2018-05-16 DIAGNOSIS — F909 Attention-deficit hyperactivity disorder, unspecified type: Secondary | ICD-10-CM | POA: Insufficient documentation

## 2018-05-16 DIAGNOSIS — R7989 Other specified abnormal findings of blood chemistry: Secondary | ICD-10-CM

## 2018-05-16 DIAGNOSIS — Z79899 Other long term (current) drug therapy: Secondary | ICD-10-CM | POA: Insufficient documentation

## 2018-05-16 DIAGNOSIS — K920 Hematemesis: Secondary | ICD-10-CM | POA: Diagnosis present

## 2018-05-16 HISTORY — DX: Panic disorder (episodic paroxysmal anxiety): F41.0

## 2018-05-16 LAB — CBC
HEMATOCRIT: 40.6 % (ref 39.0–52.0)
Hemoglobin: 14 g/dL (ref 13.0–17.0)
MCH: 30.2 pg (ref 26.0–34.0)
MCHC: 34.5 g/dL (ref 30.0–36.0)
MCV: 87.5 fL (ref 80.0–100.0)
PLATELETS: 254 10*3/uL (ref 150–400)
RBC: 4.64 MIL/uL (ref 4.22–5.81)
RDW: 12.3 % (ref 11.5–15.5)
WBC: 6.6 10*3/uL (ref 4.0–10.5)
nRBC: 0 % (ref 0.0–0.2)

## 2018-05-16 LAB — PROTIME-INR
INR: 0.99
PROTHROMBIN TIME: 13 s (ref 11.4–15.2)

## 2018-05-16 LAB — COMPREHENSIVE METABOLIC PANEL
ALK PHOS: 42 U/L (ref 38–126)
ALT: 111 U/L — AB (ref 0–44)
AST: 92 U/L — AB (ref 15–41)
Albumin: 4.3 g/dL (ref 3.5–5.0)
Anion gap: 13 (ref 5–15)
BILIRUBIN TOTAL: 1.9 mg/dL — AB (ref 0.3–1.2)
BUN: 25 mg/dL — AB (ref 6–20)
CO2: 23 mmol/L (ref 22–32)
CREATININE: 0.89 mg/dL (ref 0.61–1.24)
Calcium: 9.4 mg/dL (ref 8.9–10.3)
Chloride: 102 mmol/L (ref 98–111)
Glucose, Bld: 110 mg/dL — ABNORMAL HIGH (ref 70–99)
Potassium: 5.1 mmol/L (ref 3.5–5.1)
Sodium: 138 mmol/L (ref 135–145)
TOTAL PROTEIN: 6.9 g/dL (ref 6.5–8.1)

## 2018-05-16 LAB — POC OCCULT BLOOD, ED: Fecal Occult Bld: POSITIVE — AB

## 2018-05-16 LAB — URINALYSIS, ROUTINE W REFLEX MICROSCOPIC
BILIRUBIN URINE: NEGATIVE
Glucose, UA: NEGATIVE mg/dL
Hgb urine dipstick: NEGATIVE
Ketones, ur: NEGATIVE mg/dL
LEUKOCYTE UA: NEGATIVE
Nitrite: NEGATIVE
PH: 6 (ref 5.0–8.0)
Protein, ur: NEGATIVE mg/dL
SPECIFIC GRAVITY, URINE: 1.027 (ref 1.005–1.030)

## 2018-05-16 LAB — LIPASE, BLOOD: Lipase: 22 U/L (ref 11–51)

## 2018-05-16 MED ORDER — ONDANSETRON 8 MG PO TBDP: 8.0000 mg | ORAL_TABLET | Freq: Three times a day (TID) | ORAL | 0 refills | Status: DC | PRN

## 2018-05-16 MED ORDER — FAMOTIDINE IN NACL 20-0.9 MG/50ML-% IV SOLN
20.0000 mg | Freq: Once | INTRAVENOUS | Status: AC
  Administered 2018-05-16: 20 mg via INTRAVENOUS
  Filled 2018-05-16: qty 50

## 2018-05-16 MED ORDER — SODIUM CHLORIDE 0.9% FLUSH: 3.0000 mL | Freq: Once | INTRAVENOUS | Status: DC

## 2018-05-16 MED ORDER — FAMOTIDINE 20 MG PO TABS: 20.0000 mg | ORAL_TABLET | Freq: Two times a day (BID) | ORAL | 0 refills | Status: DC

## 2018-05-16 NOTE — ED Notes (Signed)
PT ambulated to restroom without distress. Pt states he is having a BM. Occult card given to collect. Pt endorses using ibuprofen and BC powders past few days.

## 2018-05-16 NOTE — Discharge Instructions (Signed)
Avoid taking any aspirin, ibuprofen or Naprosyn containing products.  Avoid any alcoholic beverages.  Follow-up with a GI doctor for further evaluation.  Return to the emergency room if you have recurrent or worsening symptoms.

## 2018-05-16 NOTE — ED Notes (Signed)
Dr. Leonides Grills with patient, sent him to the ER.

## 2018-05-16 NOTE — ED Triage Notes (Signed)
Pt states he has "black diarrhea" and hes been vomiting up blood for the last few days, states he "burps and it tastes like blood". Abdominal pain 5/10.

## 2018-05-16 NOTE — ED Triage Notes (Signed)
Pt endorses RUQ pain with hematemesis since yesterday.  Was sent from [facility].  He reports nausea as well with diarrhea which he describes "black" stool.  He endorses drinking 5 beers daily for the past week d/t job loss.  He felt dizzy with chills yesterday.

## 2018-05-16 NOTE — ED Provider Notes (Signed)
[facility]   CSN: [id number] Arrival date & time: 05/16/18  1137     History   Chief Complaint Chief Complaint  Patient presents with  . Hematemesis    HPI Victor [name] is a 28 y.o. male.  HPI Patient presented to the emergency room for evaluation of hematemesis as well as dark stools.  Patient states he has been drinking alcohol little bit more only.  Yesterday he had 5 beers as well as some hard alcohol shots.  He also has been using Goody powders.  Patient started having vomiting yesterday.  He noticed it was dark in color appeared bloody.  He also noticed the dark loose stools.  He denies any syncopal episodes.  He is not currently feeling lightheaded but he did feel lightheaded when he vomited.  Denies any fevers or chills.  No history of prior GI bleeds. Past Medical History:  Diagnosis Date  . ADHD (attention deficit hyperactivity disorder)   . Panic attack     Patient Active Problem List   Diagnosis Date Noted  . Acute appendicitis 08/03/2015  . Attention deficit hyperactivity disorder (ADHD) 01/23/2015    Past Surgical History:  Procedure Laterality Date  . APPENDECTOMY    . LAPAROSCOPIC APPENDECTOMY N/A 08/03/2015   Procedure: APPENDECTOMY LAPAROSCOPIC;  Surgeon: Manus RuddMatthew Tsuei, MD;  Location: [facility] OR;  Service: General;  Laterality: N/A;        Home Medications    Prior to Admission medications   Medication Sig Start Date End Date Taking? Authorizing Provider  ALPRAZolam Prudy Feeler(XANAX) 0.5 MG tablet 1/2 to 1 tab prn panic attacks. Limit 1 pill per day 04/23/18   Anne FuShugart, Clay, PA-C  amphetamine-dextroamphetamine (ADDERALL XR) 25 MG 24 hr capsule Take 1 capsule by mouth every morning. 04/23/18   Shugart, Mat Carnelay, PA-C  amphetamine-dextroamphetamine (ADDERALL) 10 MG tablet Take 1 tablet at  2 pm 04/23/18   Shugart, Mat Carnelay, PA-C  bacitracin ointment Apply 1 application topically 2 (two) times daily. Patient not taking:  Reported on 02/26/2018 05/21/16   Derwood KaplanNanavati, Ankit, MD  cephALEXin (KEFLEX) 500 MG capsule Take 2 capsules (1,000 mg total) by mouth 2 (two) times daily. Patient not taking: Reported on 02/26/2018 06/28/15   Ofilia Neaslark, Michael L, PA-C  famotidine (PEPCID) 20 MG tablet Take 1 tablet (20 mg total) by mouth 2 (two) times daily. 05/16/18   Linwood DibblesKnapp, Shivali Quackenbush, MD  gabapentin (NEURONTIN) 100 MG capsule 1-2 tabs prn anxiety. Can take up to 6 tabs/day. 03/20/18   Shugart, Mat Carnelay, PA-C  ibuprofen (ADVIL,MOTRIN) 200 MG tablet You can take 2-3 safely every 6 hours as needed for pain. 08/04/15   Sherrie GeorgeJennings, Willard, PA-C  ondansetron (ZOFRAN ODT) 8 MG disintegrating tablet Take 1 tablet (8 mg total) by mouth every 8 (eight) hours as needed for nausea or vomiting. 05/16/18   Linwood DibblesKnapp, Lillyonna Armstead, MD  oxyCODONE-acetaminophen (PERCOCET/ROXICET) 5-325 MG tablet Take 1-2 tablets by mouth every 4 (four) hours as needed for moderate pain. Patient not taking: Reported on 02/26/2018 08/04/15   Sherrie GeorgeJennings, Willard, PA-C    Family History No family history on file.  Social History Social History   Tobacco Use  . Smoking status: Current Every Day Smoker    Packs/day: 0.01    Types: Cigarettes    Last attempt to quit: 04/01/2015    Years since quitting: 3.1  . Smokeless tobacco: Never Used  Substance Use Topics  . Alcohol use: Yes    Alcohol/week: 5.0 standard drinks    Types:  5 Cans of beer per week  . Drug use: Yes    Types: Cocaine     Allergies   Patient has no known allergies.   Review of Systems Review of Systems  All other systems reviewed and are negative.    Physical Exam Updated Vital Signs BP (!) 141/97 (BP Location: Right Arm)   Pulse 94   Temp 98.2 F (36.8 C) (Oral)   Resp 16   Ht 1.727 m (5\' 8" )   Wt 74.8 kg   SpO2 100%   BMI 25.09 kg/m   Physical Exam Vitals signs and nursing note reviewed.  Constitutional:      General: He is not in acute distress.    Appearance: He is well-developed.  HENT:      Head: Normocephalic and atraumatic.     Right Ear: External ear normal.     Left Ear: External ear normal.  Eyes:     General: No scleral icterus.       Right eye: No discharge.        Left eye: No discharge.     Conjunctiva/sclera: Conjunctivae normal.  Neck:     Musculoskeletal: Neck supple.     Trachea: No tracheal deviation.  Cardiovascular:     Rate and Rhythm: Normal rate and regular rhythm.  Pulmonary:     Effort: Pulmonary effort is normal. No respiratory distress.     Breath sounds: Normal breath sounds. No stridor. No wheezing or rales.  Abdominal:     General: Bowel sounds are normal. There is no distension.     Palpations: Abdomen is soft.     Tenderness: There is no abdominal tenderness. There is no guarding or rebound.  Musculoskeletal:        General: No tenderness.  Skin:    General: Skin is warm and dry.     Findings: No rash.  Neurological:     Mental Status: He is alert.     Cranial Nerves: No cranial nerve deficit (no facial droop, extraocular movements intact, no slurred speech).     Sensory: No sensory deficit.     Motor: No abnormal muscle tone or seizure activity.     Coordination: Coordination normal.      ED Treatments / Results  Labs (all labs ordered are listed, but only abnormal results are displayed) Labs Reviewed  COMPREHENSIVE METABOLIC PANEL - Abnormal; Notable for the following components:      Result Value   Glucose, Bld 110 (*)    BUN 25 (*)    AST 92 (*)    ALT 111 (*)    Total Bilirubin 1.9 (*)    All other components within normal limits  POC OCCULT BLOOD, ED - Abnormal; Notable for the following components:   Fecal Occult Bld POSITIVE (*)    All other components within normal limits  LIPASE, BLOOD  CBC  URINALYSIS, ROUTINE W REFLEX MICROSCOPIC  PROTIME-INR      Procedures Procedures (including critical care time)  Medications Ordered in ED Medications  sodium chloride flush (NS) 0.9 % injection 3 mL (has no  administration in time range)  famotidine (PEPCID) IVPB 20 mg premix (0 mg Intravenous Stopped 05/16/18 1307)     Initial Impression / Assessment and Plan / ED Course  I have reviewed the triage vital signs and the nursing notes.  Pertinent labs & imaging results that were available during my care of the patient were reviewed by me and considered in my medical decision  making (see chart for details).   Patient presented to the emergency room for evaluation of hematemesis and dark stools.  Patient admits to recent use of NSAIDs as well as heavier than usual alcohol use.  Patient's laboratory tests do show guaiac positive stools however his hemoglobin is normal.  He has not had any hematemesis here.  Patient does have elevated LFTs.  This goes along with his increased alcohol consumption.  The patient appears comfortable at that.  I think he is stable for outpatient GI follow-up.  I did stress to him avoid using any NSAIDs and to not drink any alcohol.  He understands return to the emergency room for recurrent vomiting or worsening symptoms.  Final Clinical Impressions(s) / ED Diagnoses   Final diagnoses:  Hematemesis, presence of nausea not specified  Elevated LFTs    ED Discharge Orders         Ordered    ondansetron (ZOFRAN ODT) 8 MG disintegrating tablet  Every 8 hours PRN     05/16/18 1417    famotidine (PEPCID) 20 MG tablet  2 times daily     05/16/18 1417           Linwood Dibbles, MD 05/16/18 1419

## 2018-05-22 ENCOUNTER — Ambulatory Visit: Payer: Commercial Managed Care - PPO | Admitting: Psychiatry

## 2018-05-25 ENCOUNTER — Ambulatory Visit: Payer: Commercial Managed Care - PPO | Admitting: Physician Assistant

## 2018-05-25 ENCOUNTER — Ambulatory Visit: Payer: Commercial Managed Care - PPO | Admitting: Psychiatry

## 2018-05-30 ENCOUNTER — Ambulatory Visit: Payer: Commercial Managed Care - PPO | Admitting: Psychiatry

## 2018-05-30 DIAGNOSIS — F411 Generalized anxiety disorder: Secondary | ICD-10-CM

## 2018-05-30 DIAGNOSIS — F909 Attention-deficit hyperactivity disorder, unspecified type: Secondary | ICD-10-CM | POA: Diagnosis not present

## 2018-05-30 MED ORDER — AMPHETAMINE-DEXTROAMPHETAMINE 10 MG PO TABS: 10.0000 mg | ORAL_TABLET | Freq: Every day | ORAL | 0 refills | Status: DC

## 2018-05-30 MED ORDER — AMPHETAMINE-DEXTROAMPHET ER 25 MG PO CP24: 25.0000 mg | ORAL_CAPSULE | ORAL | 0 refills | Status: DC

## 2018-05-30 MED ORDER — GABAPENTIN 100 MG PO CAPS: ORAL_CAPSULE | ORAL | 1 refills | Status: DC

## 2018-05-30 NOTE — Progress Notes (Signed)
[facility]  Patient ID: Victor [name],  MRN: [id number]  PCP: Daisy Floro, MD  Date of Evaluation: 05/30/2018 Time spent:20 minutes  Chief Complaint:   HISTORY/CURRENT STATUS: HPI seen a month ago and was doing well.  He has some elevated pulse and blood pressure. He is continued to do well.  Does have anxiety at times and he uses Xanax once or twice a day.  He denies depression.  Individual Medical History/ Review of Systems: Changes? :no Allergies: Patient has no known allergies.  Current Medications:  Current Outpatient Medications:  .  amphetamine-dextroamphetamine (ADDERALL XR) 25 MG 24 hr capsule, Take 1 capsule by mouth every morning., Disp: 30 capsule, Rfl: 0 .  amphetamine-dextroamphetamine (ADDERALL) 10 MG tablet, Take 1 tablet at  2 pm, Disp: 30 tablet, Rfl: 0 .  gabapentin (NEURONTIN) 100 MG capsule, 1-2 tabs prn anxiety. Can take up to 6 tabs/day., Disp: 120 capsule, Rfl: 1 .  ALPRAZolam (XANAX) 0.5 MG tablet, 1/2 to 1 tab prn panic attacks. Limit 1 pill per day, Disp: 30 tablet, Rfl: 1 .  amphetamine-dextroamphetamine (ADDERALL XR) 25 MG 24 hr capsule, Take 1 capsule by mouth every morning., Disp: 30 capsule, Rfl: 0 .  amphetamine-dextroamphetamine (ADDERALL XR) 25 MG 24 hr capsule, Take 1 capsule by mouth every morning., Disp: 30 capsule, Rfl: 0 .  amphetamine-dextroamphetamine (ADDERALL) 10 MG tablet, Take 1 tablet (10 mg total) by mouth daily with breakfast., Disp: 30 tablet, Rfl: 0 .  amphetamine-dextroamphetamine (ADDERALL) 10 MG tablet, Take 1 tablet (10 mg total) by mouth daily with breakfast., Disp: 30 tablet, Rfl: 0 .  amphetamine-dextroamphetamine (ADDERALL) 10 MG tablet, Take 1 tablet (10 mg total) by mouth daily with breakfast., Disp: 30 tablet, Rfl: 0 .  bacitracin ointment, Apply 1 application topically 2 (two) times daily. (Patient not taking: Reported on 02/26/2018), Disp: 28 g, Rfl: 0 .  cephALEXin (KEFLEX) 500 MG capsule, Take 2  capsules (1,000 mg total) by mouth 2 (two) times daily. (Patient not taking: Reported on 02/26/2018), Disp: 40 capsule, Rfl: 0 .  famotidine (PEPCID) 20 MG tablet, Take 1 tablet (20 mg total) by mouth 2 (two) times daily., Disp: 14 tablet, Rfl: 0 .  gabapentin (NEURONTIN) 100 MG capsule, 2 tabs prn anxiety, Disp: 100 capsule, Rfl: 1 .  ibuprofen (ADVIL,MOTRIN) 200 MG tablet, You can take 2-3 safely every 6 hours as needed for pain., Disp: , Rfl:  .  ondansetron (ZOFRAN ODT) 8 MG disintegrating tablet, Take 1 tablet (8 mg total) by mouth every 8 (eight) hours as needed for nausea or vomiting., Disp: 12 tablet, Rfl: 0 .  oxyCODONE-acetaminophen (PERCOCET/ROXICET) 5-325 MG tablet, Take 1-2 tablets by mouth every 4 (four) hours as needed for moderate pain. (Patient not taking: Reported on 02/26/2018), Disp: 40 tablet, Rfl: 0 Medication Side Effects: none  Family Medical/ Social History: Changes? no  MENTAL HEALTH EXAM:  There were no vitals taken for this visit.There is no height or weight on file to calculate BMI.  General Appearance: Casual  Eye Contact:  Good  Speech:  Normal Rate  Volume:  Normal  Mood:  Euthymic  Affect:  Appropriate  Thought Process:  Linear  Orientation:  Full (Time, Place, and Person)  Thought Content: Logical   Suicidal Thoughts:  No  Homicidal Thoughts:  No  Memory:  WNL  Judgement:  Good  Insight:  Good  Psychomotor Activity:  Normal  Concentration:  Concentration: Good  Recall:  Good  Fund of Knowledge: Good  Language:  Good  Assets:  Desire for Improvement  ADL's:  Intact  Cognition: WNL  Prognosis:  Good    DIAGNOSES:    ICD-10-CM   1. Attention deficit hyperactivity disorder (ADHD), unspecified ADHD type F90.9   2. Anxiety state F41.1     Receiving Psychotherapy: No    RECOMMENDATIONS: Patient brought his blood pressure readings from home the highest is been 127/91 with a pulse is high as 111.  We will continue to take the Adderall XR 25 mg a  day along with the extra Adderall plain 10 mg a day.  He will continue to check his blood pressure.  I have increased his gabapentin at 100 mg to 3 times a day to help with anxiety.  Cautioned patient about using too much Xanax.  Check in 3 months   Anne Fu, [place]

## 2018-05-31 ENCOUNTER — Telehealth: Payer: Self-pay | Admitting: Psychiatry

## 2018-05-31 NOTE — Telephone Encounter (Signed)
Pharmacy has Gabapentin RX and needs to know the correct directions.  Need # of times per day the patient should take it.

## 2018-06-03 HISTORY — PX: UPPER GASTROINTESTINAL ENDOSCOPY: SHX188

## 2018-06-06 ENCOUNTER — Encounter (INDEPENDENT_AMBULATORY_CARE_PROVIDER_SITE_OTHER): Payer: Self-pay

## 2018-06-06 ENCOUNTER — Emergency Department (HOSPITAL_COMMUNITY): Payer: Commercial Managed Care - PPO

## 2018-06-06 ENCOUNTER — Emergency Department (HOSPITAL_COMMUNITY)
Admission: EM | Admit: 2018-06-06 | Discharge: 2018-06-06 | Disposition: A | Discharge status: Home / Self Care | Payer: Commercial Managed Care - PPO | Attending: Emergency Medicine | Admitting: Emergency Medicine

## 2018-06-06 ENCOUNTER — Encounter (HOSPITAL_COMMUNITY): Payer: Self-pay | Admitting: Emergency Medicine

## 2018-06-06 ENCOUNTER — Other Ambulatory Visit: Payer: Self-pay

## 2018-06-06 ENCOUNTER — Encounter: Payer: Self-pay | Admitting: Nurse Practitioner

## 2018-06-06 ENCOUNTER — Ambulatory Visit (INDEPENDENT_AMBULATORY_CARE_PROVIDER_SITE_OTHER): Payer: Commercial Managed Care - PPO | Admitting: Nurse Practitioner

## 2018-06-06 VITALS — BP 110/80 | HR 76 | Ht 67.0 in | Wt 169.5 lb

## 2018-06-06 DIAGNOSIS — F17210 Nicotine dependence, cigarettes, uncomplicated: Secondary | ICD-10-CM | POA: Diagnosis not present

## 2018-06-06 DIAGNOSIS — R200 Anesthesia of skin: Secondary | ICD-10-CM | POA: Insufficient documentation

## 2018-06-06 DIAGNOSIS — Z79899 Other long term (current) drug therapy: Secondary | ICD-10-CM | POA: Insufficient documentation

## 2018-06-06 DIAGNOSIS — M542 Cervicalgia: Secondary | ICD-10-CM | POA: Diagnosis not present

## 2018-06-06 DIAGNOSIS — R945 Abnormal results of liver function studies: Secondary | ICD-10-CM

## 2018-06-06 DIAGNOSIS — R202 Paresthesia of skin: Secondary | ICD-10-CM | POA: Diagnosis not present

## 2018-06-06 DIAGNOSIS — R7989 Other specified abnormal findings of blood chemistry: Secondary | ICD-10-CM

## 2018-06-06 DIAGNOSIS — K920 Hematemesis: Secondary | ICD-10-CM | POA: Diagnosis not present

## 2018-06-06 LAB — COMPREHENSIVE METABOLIC PANEL
ALT: 47 U/L — ABNORMAL HIGH (ref 0–44)
AST: 48 U/L — ABNORMAL HIGH (ref 15–41)
Albumin: 4.7 g/dL (ref 3.5–5.0)
Alkaline Phosphatase: 45 U/L (ref 38–126)
Anion gap: 10 (ref 5–15)
BUN: 8 mg/dL (ref 6–20)
CO2: 24 mmol/L (ref 22–32)
Calcium: 9.3 mg/dL (ref 8.9–10.3)
Chloride: 106 mmol/L (ref 98–111)
Creatinine, Ser: 0.91 mg/dL (ref 0.61–1.24)
GFR calc Af Amer: >60 mL/min (ref >60)
GFR calc non Af Amer: >60 mL/min (ref >60)
GLUCOSE: 143 mg/dL — AB (ref 70–99)
Potassium: 4 mmol/L (ref 3.5–5.1)
Sodium: 140 mmol/L (ref 135–145)
Total Bilirubin: 0.8 mg/dL (ref 0.3–1.2)
Total Protein: 7.2 g/dL (ref 6.5–8.1)

## 2018-06-06 LAB — CBC
HCT: 40.2 % (ref 39.0–52.0)
Hemoglobin: 13.5 g/dL (ref 13.0–17.0)
MCH: 29.9 pg (ref 26.0–34.0)
MCHC: 33.6 g/dL (ref 30.0–36.0)
MCV: 89.1 fL (ref 80.0–100.0)
Platelets: 319 10*3/uL (ref 150–400)
RBC: 4.51 MIL/uL (ref 4.22–5.81)
RDW: 12.9 % (ref 11.5–15.5)
WBC: 6.7 10*3/uL (ref 4.0–10.5)
nRBC: 0 % (ref 0.0–0.2)

## 2018-06-06 LAB — DIFFERENTIAL
Abs Immature Granulocytes: 0.02 10*3/uL (ref 0.00–0.07)
BASOS PCT: 1 %
Basophils Absolute: 0.1 10*3/uL (ref 0.0–0.1)
Eosinophils Absolute: 0.2 10*3/uL (ref 0.0–0.5)
Eosinophils Relative: 2 %
Immature Granulocytes: 0 %
Lymphocytes Relative: 35 %
Lymphs Abs: 2.4 10*3/uL (ref 0.7–4.0)
Monocytes Absolute: 0.5 10*3/uL (ref 0.1–1.0)
Monocytes Relative: 7 %
NEUTROS PCT: 55 %
Neutro Abs: 3.6 10*3/uL (ref 1.7–7.7)

## 2018-06-06 LAB — PROTIME-INR
INR: 1 (ref 0.8–1.2)
Prothrombin Time: 13.4 seconds (ref 11.4–15.2)

## 2018-06-06 LAB — I-STAT CREATININE, ED: Creatinine, Ser: 0.9 mg/dL (ref 0.61–1.24)

## 2018-06-06 LAB — APTT: aPTT: 32 seconds (ref 24–36)

## 2018-06-06 MED ORDER — SODIUM CHLORIDE 0.9% FLUSH: 3.0000 mL | Freq: Once | INTRAVENOUS | Status: DC

## 2018-06-06 NOTE — ED Triage Notes (Signed)
Pt states he has had numbness and tingling to his face/neck, states the left side of his neck get swollen sometimes. As well

## 2018-06-06 NOTE — Discharge Instructions (Signed)
All your laboratory and CT results were normal today. Please follow up with your primary care physician to further evaluate your symptoms.

## 2018-06-06 NOTE — Patient Instructions (Addendum)
If you are age 27 or older, your body mass index should be between 23-30. Your Body mass index is 26.55 kg/m. If this is out of the aforementioned range listed, please consider follow up with your Primary Care Provider.  If you are age 65 or younger, your body mass index should be between 19-25. Your Body mass index is 26.55 kg/m. If this is out of the aformentioned range listed, please consider follow up with your Primary Care Provider.   You have been scheduled for an endoscopy. Please follow written instructions given to you at your visit today. If you use inhalers (even only as needed), please bring them with you on the day of your procedure. Your physician has requested that you go to www.startemmi.com and enter the access code given to you at your visit today. This web site gives a general overview about your procedure. However, you should still follow specific instructions given to you by our office regarding your preparation for the procedure.  Resume Pepcid 20 mg - can take only once a day.   Thank you for choosing me and [facility].   Willette Cluster, NP

## 2018-06-06 NOTE — ED Notes (Signed)
Reviewed d/c instructions with pt, who verbalized understanding and had no outstanding questions. Pt departed in NAD, refused use of wheelchair.   

## 2018-06-06 NOTE — Progress Notes (Signed)
ASSESSMENT / PLAN:   18.  28 year old male with ADD recently seen in ED for evaluation of upper abdominal discomfort / hematemesis followed by melena at home x3 days.  He had several beers and some shots of liquor the night before. In ED his hemoglobin was normal at 14, BUN mildly elevated.  Rule out Mallory-Weiss tear.  Rule out PUD in setting of chronic NSAIDs.  Symptoms completely resolved. No longer taking Goody powders. He stopped Pepcid prescribed by ED.  -Even though his symptoms resolve days ago I still think it is prudent to follow through with an EGD actually since patient may at some point restart Goody powders or other NSAIDs.  He is agreeable to an EGD. The risks and benefits of EGD were discussed and the patient agrees to proceed.  -Resume daily Pepcid for now  2. Abnormal liver tests (noticed after patient left clinic).   Prior liver tests in May 2017 were normal.  He had transient epigastric/RUQ discomfort in conjunction with the hematemesis.  AST to ALT ratio not typical for EtOH though could still be contributing factor.  Medication related? No further abdominal discomfort. -Biliary source of pain not excluded.  -He will need repeat liver tests with additional workup if still abnormal.  I can see in back in a couple of weeks. His EGD is scheduled for 06/14/18  HPI:    Chief Complaint:   ED follow up  Patient is a 28 yo male with ADD, tobacco use, history of appendectomy who was seen in the ED on 05/16/2018 for evaluation of  Epigastric / RUQ discomfort and hematemesis.  The day prior patient had had some shots of alcohol as well as several beers.  For years he has taken 1-2 Goody powders a day for general aches. First emesis consisted of food. Second episode was basically fluid and a small amount of blood towards the end.  By the third episode emesis consisted of nothing more than bright red blood. Hematemesis followed by melena x 3 days.  Patient was released from  the ED with instructions to discontinue Goody powders.  He was started on famotidine 20 mg twice daily and given Zofran for nausea as needed.    Victor [name] took the famotidine for approximately 5 days then discontinued it.  He really did not see a need to continue the Pepcid as the abdominal pain, vomiting,  and black stools had resolved.  His stools have been back to normal color over the last few weeks.  No further nausea, he never did require the Zofran.  He has discontinued the Semmes powders. No other GI complaints.    Data Reviewed:  05/16/18 ED WBC 6.6, hemoglobin 14, MCV 87, platelets 254 BUN 25, creatinine 0.89, alkaline phosphatase 42, AST 92, ALT 111, total bili 1.9 Lipase 22   Past Medical History:  Diagnosis Date  . ADHD (attention deficit hyperactivity disorder)   . Anxiety   . Panic attack     Past Surgical History:  Procedure Laterality Date  . LAPAROSCOPIC APPENDECTOMY N/A 08/03/2015   Procedure: APPENDECTOMY LAPAROSCOPIC;  Surgeon: Manus Rudd, MD;  Location: [facility] OR;  Service: General;  Laterality: N/A;   Family History  Family history unknown: Yes   Social History   Tobacco Use  . Smoking status: Current Every Day Smoker    Packs/day: 0.01    Types: Cigarettes    Last attempt to quit:  04/01/2015    Years since quitting: 3.1  . Smokeless tobacco: Never Used  Substance Use Topics  . Alcohol use: Yes    Alcohol/week: 5.0 standard drinks    Types: 5 Cans of beer per week    Comment: 2-3 per day  . Drug use: Yes    Types: Cocaine   Current Outpatient Medications  Medication Sig Dispense Refill  . ALPRAZolam (XANAX) 0.5 MG tablet 1/2 to 1 tab prn panic attacks. Limit 1 pill per day 30 tablet 1  . amphetamine-dextroamphetamine (ADDERALL XR) 25 MG 24 hr capsule Take 1 capsule by mouth every morning. 30 capsule 0  . amphetamine-dextroamphetamine (ADDERALL) 10 MG tablet Take 1 tablet at  2 pm 30 tablet 0  . famotidine (PEPCID) 20 MG tablet Take 1 tablet (20 mg  total) by mouth 2 (two) times daily. 14 tablet 0  . gabapentin (NEURONTIN) 100 MG capsule 1-2 tabs prn anxiety. Can take up to 6 tabs/day. 120 capsule 1  . ondansetron (ZOFRAN ODT) 8 MG disintegrating tablet Take 1 tablet (8 mg total) by mouth every 8 (eight) hours as needed for nausea or vomiting. 12 tablet 0   No current facility-administered medications for this visit.    No Known Allergies   Review of Systems: All systems reviewed and negative except where noted in HPI.   Creatinine clearance cannot be calculated (Patient's most recent lab result is older than the maximum 21 days allowed.)   Physical Exam:    Wt Readings from Last 3 Encounters:  06/06/18 169 lb 8 oz (76.9 kg)  05/16/18 165 lb (74.8 kg)  05/20/16 165 lb (74.8 kg)    BP 110/80 (BP Location: Left Arm, Patient Position: Sitting, Cuff Size: Normal)   Pulse 76   Ht 5\' 7"  (1.702 m) Comment: height measured without shoes  Wt 169 lb 8 oz (76.9 kg)   BMI 26.55 kg/m  Constitutional:  Pleasant thin male in no acute distress. Psychiatric: Normal mood and affect. Behavior is normal. EENT: Pupils normal.  Conjunctivae are normal. No scleral icterus. Neck supple.  Cardiovascular: Normal rate, regular rhythm. No edema Pulmonary/chest: Effort normal and breath sounds normal. No wheezing, rales or rhonchi. Abdominal: Soft, nondistended, nontender. Bowel sounds active throughout. There are no masses palpable. No hepatomegaly. Neurological: Alert and oriented to person place and time. Skin: Skin is warm and dry. No rashes noted.  Victor Cluster, NP  06/06/2018, 2:24 PM  Cc: Daisy Floro, MD

## 2018-06-06 NOTE — ED Provider Notes (Signed)
[facility] EMERGENCY DEPARTMENT Provider Note   CSN: [id number] Arrival date & time: 06/06/18  1624    History   Chief Complaint Chief Complaint  Patient presents with  . Numbness    HPI Derrick Mockler is a 28 y.o. male.     28 y.o male with a PMH of ADHD resents to the ED with a chief complaint of left neck pain times few weeks.  Reports feeling the left side of his neck bulging along with pulsating when he is stressed and active.  He also reports feeling a tingling sensation to his face when this occurs. Reports his symptoms are better at rest.  Patient has not tried any medical therapy for relieving symptoms.  He denies any previous history of CVAs or family history of CVAs.  Denies any dizziness, lightheadedness, headache, fever, weakness or tingling to his upper or lower limbs.     Past Medical History:  Diagnosis Date  . ADHD (attention deficit hyperactivity disorder)   . Anxiety   . Panic attack     Patient Active Problem List   Diagnosis Date Noted  . Acute appendicitis 08/03/2015  . Attention deficit hyperactivity disorder (ADHD) 01/23/2015    Past Surgical History:  Procedure Laterality Date  . LAPAROSCOPIC APPENDECTOMY N/A 08/03/2015   Procedure: APPENDECTOMY LAPAROSCOPIC;  Surgeon: Manus Rudd, MD;  Location: [facility] OR;  Service: General;  Laterality: N/A;        Home Medications    Prior to Admission medications   Medication Sig Start Date End Date Taking? Authorizing Provider  ALPRAZolam Prudy Feeler) 0.5 MG tablet 1/2 to 1 tab prn panic attacks. Limit 1 pill per day 04/23/18   Anne Fu, PA-C  amphetamine-dextroamphetamine (ADDERALL XR) 25 MG 24 hr capsule Take 1 capsule by mouth every morning. 05/30/18   Shugart, Mat Carne, PA-C  amphetamine-dextroamphetamine (ADDERALL) 10 MG tablet Take 1 tablet at  2 pm 04/23/18   Shugart, Mat Carne, PA-C  famotidine (PEPCID) 20 MG tablet Take 1 tablet (20 mg total) by mouth 2 (two) times daily. 05/16/18   Linwood Dibbles, MD  gabapentin (NEURONTIN) 100 MG capsule 1-2 tabs prn anxiety. Can take up to 6 tabs/day. 03/20/18   Shugart, Mat Carne, PA-C  ondansetron (ZOFRAN ODT) 8 MG disintegrating tablet Take 1 tablet (8 mg total) by mouth every 8 (eight) hours as needed for nausea or vomiting. 05/16/18   Linwood Dibbles, MD    Family History Family History  Family history unknown: Yes    Social History Social History   Tobacco Use  . Smoking status: Current Every Day Smoker    Packs/day: 0.01    Types: Cigarettes    Last attempt to quit: 04/01/2015    Years since quitting: 3.1  . Smokeless tobacco: Never Used  Substance Use Topics  . Alcohol use: Yes    Alcohol/week: 5.0 standard drinks    Types: 5 Cans of beer per week    Comment: 2-3 per day  . Drug use: Yes    Types: Cocaine     Allergies   Patient has no known allergies.   Review of Systems Review of Systems  Constitutional: Negative for chills and fever.  HENT: Negative for ear pain and sore throat.   Eyes: Negative for pain and visual disturbance.  Respiratory: Negative for cough and shortness of breath.   Cardiovascular: Negative for chest pain and palpitations.  Gastrointestinal: Negative for abdominal pain and vomiting.  Genitourinary: Negative for dysuria and hematuria.  Musculoskeletal: Positive for  neck pain. Negative for arthralgias and back pain.  Skin: Negative for color change and rash.  Neurological: Negative for seizures and syncope.  All other systems reviewed and are negative.    Physical Exam Updated Vital Signs BP 126/83   Pulse 64   Temp 98.3 F (36.8 C) (Oral)   Resp 16   Ht 5\' 7"  (1.702 m)   Wt 76.7 kg   SpO2 99%   BMI 26.47 kg/m   Physical Exam Vitals signs and nursing note reviewed.  Constitutional:      Appearance: He is well-developed.  HENT:     Head: Normocephalic and atraumatic.  Eyes:     General: No scleral icterus.    Pupils: Pupils are equal, round, and reactive to light.  Neck:      Musculoskeletal: Normal range of motion. Normal range of motion. No erythema, neck rigidity, injury, pain with movement or torticollis.     Thyroid: No thyroid mass, thyromegaly or thyroid tenderness.     Vascular: Normal carotid pulses. No JVD.     Trachea: Trachea normal. No tracheal deviation.     Meningeal: Brudzinski's sign and Kernig's sign absent.  Cardiovascular:     Heart sounds: Normal heart sounds.  Pulmonary:     Effort: Pulmonary effort is normal.     Breath sounds: Normal breath sounds. No wheezing.  Chest:     Chest wall: No tenderness.  Abdominal:     General: Bowel sounds are normal. There is no distension.     Palpations: Abdomen is soft.     Tenderness: There is no abdominal tenderness.  Musculoskeletal:        General: No tenderness or deformity.  Lymphadenopathy:     Cervical: No cervical adenopathy.  Skin:    General: Skin is warm and dry.  Neurological:     Mental Status: He is alert and oriented to person, place, and time.     Comments: Alert, oriented, thought content appropriate. Speech fluent without evidence of aphasia. Able to follow 2 step commands without difficulty.  Cranial Nerves:  II:  Peripheral visual fields grossly normal, pupils, round, reactive to light III,IV, VI: ptosis not present, extra-ocular motions intact bilaterally  V,VII: smile symmetric, facial light touch sensation equal VIII: hearing grossly normal bilaterally  IX,X: midline uvula rise  XI: bilateral shoulder shrug equal and strong XII: midline tongue extension  Motor:  5/5 in upper and lower extremities bilaterally including strong and equal grip strength and dorsiflexion/plantar flexion Sensory: light touch normal in all extremities.  Cerebellar: normal finger-to-nose with bilateral upper extremities, pronator drift negative Gait: normal gait and balance       ED Treatments / Results  Labs (all labs ordered are listed, but only abnormal results are displayed) Labs  Reviewed  COMPREHENSIVE METABOLIC PANEL - Abnormal; Notable for the following components:      Result Value   Glucose, Bld 143 (*)    AST 48 (*)    ALT 47 (*)    All other components within normal limits  PROTIME-INR  APTT  CBC  DIFFERENTIAL  I-STAT CREATININE, ED  CBG MONITORING, ED    EKG None  Radiology Ct Head Wo Contrast  Result Date: 06/06/2018 CLINICAL DATA:  Face and neck numbness and tingling. Intermittent left neck swelling. EXAM: CT HEAD WITHOUT CONTRAST TECHNIQUE: Contiguous axial images were obtained from the base of the skull through the vertex without intravenous contrast. COMPARISON:  10/29/2010. FINDINGS: Brain: Normal appearing cerebral hemispheres and posterior  fossa structures. Normal size and position of the ventricles. No intracranial hemorrhage, mass lesion or CT evidence of acute infarction. Vascular: No hyperdense vessel or unexpected calcification. Skull: Normal. Negative for fracture or focal lesion. Sinuses/Orbits: Small left maxillary sinus retention cyst. Unremarkable orbits. Other: None. IMPRESSION: Normal examination. Electronically Signed   By: Beckie Salts M.D.   On: 06/06/2018 18:17    Procedures Procedures (including critical care time)  Medications Ordered in ED Medications  sodium chloride flush (NS) 0.9 % injection 3 mL (has no administration in time range)     Initial Impression / Assessment and Plan / ED Course  I have reviewed the triage vital signs and the nursing notes.  Pertinent labs & imaging results that were available during my care of the patient were reviewed by me and considered in my medical decision making (see chart for details).       Patient with no previous medical history presents to the ED with complaints of left neck pain along with facial paresthesias.  He reports this has been going on for several weeks and he has not been seen by anyone about this.  He reports the pain swelling to his left neck happens whenever he  exerts himself, stress or anger.  He reports this is better at rest.  Has not tried any medical therapy for relieving symptoms  He reports "I googled this on the Internet and said that I could be having a stroke or an aneurysm ".  CT head obtained to rule out any acute hemorrhage or infarct.  CT showed no acute process at this time.  Patient's vital signs have been stable, no tachycardia, hypotension or abnormal vital.  CMP showed slight elevation of LFTs, looks like patient has been drinking a little bit more recently according to his previous visit in the ED last month.  The rest of his CMP was within normal limits no electrolyte abnormality displaying paresthesias.  CBC showed no leukocytosis, creatinine level is within normal limits.  PT/INR are unremarkable.  EKG showed no changes consistent with infarct or STEMI.  Patient was explained of his results and he reports has been bothering him for weeks and he would like some answers.  I have informed him that unfortunately no further work-up is warranted at this time with his vital signs being stable blood work unremarkable along with reassuring CT and neurological exam.  Patient is advised to follow-up with PCP.  Reports understanding and agreement.  Final Clinical Impressions(s) / ED Diagnoses   Final diagnoses:  Paresthesia  Neck pain    ED Discharge Orders    None       Claude Manges, PA-C 06/06/18 2329    Margarita Grizzle, MD 06/07/18 [phone]

## 2018-06-07 NOTE — Progress Notes (Signed)
Agree with assessment and plan per PA Guenther. 

## 2018-06-10 ENCOUNTER — Encounter (HOSPITAL_COMMUNITY): Payer: Self-pay | Admitting: *Deleted

## 2018-06-10 ENCOUNTER — Emergency Department (HOSPITAL_COMMUNITY): Payer: Commercial Managed Care - PPO

## 2018-06-10 ENCOUNTER — Other Ambulatory Visit: Payer: Self-pay

## 2018-06-10 ENCOUNTER — Emergency Department (HOSPITAL_COMMUNITY)
Admission: EM | Admit: 2018-06-10 | Discharge: 2018-06-10 | Disposition: A | Discharge status: Home / Self Care | Payer: Commercial Managed Care - PPO | Attending: Emergency Medicine | Admitting: Emergency Medicine

## 2018-06-10 DIAGNOSIS — F909 Attention-deficit hyperactivity disorder, unspecified type: Secondary | ICD-10-CM | POA: Insufficient documentation

## 2018-06-10 DIAGNOSIS — M542 Cervicalgia: Secondary | ICD-10-CM | POA: Insufficient documentation

## 2018-06-10 DIAGNOSIS — Z79899 Other long term (current) drug therapy: Secondary | ICD-10-CM | POA: Diagnosis not present

## 2018-06-10 DIAGNOSIS — R221 Localized swelling, mass and lump, neck: Secondary | ICD-10-CM | POA: Diagnosis not present

## 2018-06-10 DIAGNOSIS — F17210 Nicotine dependence, cigarettes, uncomplicated: Secondary | ICD-10-CM | POA: Insufficient documentation

## 2018-06-10 MED ORDER — IOPAMIDOL (ISOVUE-370) INJECTION 76%
50.0000 mL | Freq: Once | INTRAVENOUS | Status: AC | PRN
  Administered 2018-06-10: 50 mL via INTRAVENOUS

## 2018-06-10 MED ORDER — IOPAMIDOL (ISOVUE-370) INJECTION 76%
INTRAVENOUS | Status: AC
  Filled 2018-06-10: qty 50

## 2018-06-10 NOTE — Discharge Instructions (Addendum)
Follow up with your primary care doctor for further evaluation.

## 2018-06-10 NOTE — ED Provider Notes (Signed)
[facility] EMERGENCY DEPARTMENT Provider Note   CSN: [id number] Arrival date & time: 06/10/18  1640    History   Chief Complaint Chief Complaint  Patient presents with  . Neck Pain    HPI Victor [name] is a 28 y.o. male.   HPI Patient presents to the emergency room for evaluation of discomfort and swelling in the left neck.  Patient states for several days he has had trouble with discomfort and numbness on the left side of his face.  Associated with that he has had discomfort in his left neck.  Patient states he was seen in the emergency room the other day.  He had a CT scan of his head that was negative.  They recommended outpatient follow-up with his primary care doctor.  There was some discussion of having a vascular ultrasound.  Patient states his symptoms have been getting worse.  Certain positions he feels a pulsing throbbing mass in the left side of his neck.  He feels like he is getting worse.  He is concerned that he might have an aneurysm. Past Medical History:  Diagnosis Date  . ADHD (attention deficit hyperactivity disorder)   . Anxiety   . Panic attack     Patient Active Problem List   Diagnosis Date Noted  . Acute appendicitis 08/03/2015  . Attention deficit hyperactivity disorder (ADHD) 01/23/2015    Past Surgical History:  Procedure Laterality Date  . LAPAROSCOPIC APPENDECTOMY N/A 08/03/2015   Procedure: APPENDECTOMY LAPAROSCOPIC;  Surgeon: Manus Rudd, MD;  Location: [facility] OR;  Service: General;  Laterality: N/A;        Home Medications    Prior to Admission medications   Medication Sig Start Date End Date Taking? Authorizing Provider  ALPRAZolam Prudy Feeler) 0.5 MG tablet 1/2 to 1 tab prn panic attacks. Limit 1 pill per day 04/23/18   Anne Fu, PA-C  amphetamine-dextroamphetamine (ADDERALL XR) 25 MG 24 hr capsule Take 1 capsule by mouth every morning. 05/30/18   Shugart, Mat Carne, PA-C  amphetamine-dextroamphetamine (ADDERALL) 10 MG tablet  Take 1 tablet at  2 pm 04/23/18   Shugart, Mat Carne, PA-C  famotidine (PEPCID) 20 MG tablet Take 1 tablet (20 mg total) by mouth 2 (two) times daily. 05/16/18   Linwood Dibbles, MD  gabapentin (NEURONTIN) 100 MG capsule 1-2 tabs prn anxiety. Can take up to 6 tabs/day. 03/20/18   Shugart, Mat Carne, PA-C  ondansetron (ZOFRAN ODT) 8 MG disintegrating tablet Take 1 tablet (8 mg total) by mouth every 8 (eight) hours as needed for nausea or vomiting. 05/16/18   Linwood Dibbles, MD    Family History Family History  Family history unknown: Yes    Social History Social History   Tobacco Use  . Smoking status: Current Every Day Smoker    Packs/day: 0.01    Types: Cigarettes    Last attempt to quit: 04/01/2015    Years since quitting: 3.1  . Smokeless tobacco: Never Used  Substance Use Topics  . Alcohol use: Yes    Alcohol/week: 5.0 standard drinks    Types: 5 Cans of beer per week    Comment: 2-3 per day  . Drug use: Yes    Types: Cocaine     Allergies   Patient has no known allergies.   Review of Systems Review of Systems  All other systems reviewed and are negative.    Physical Exam Updated Vital Signs BP (!) 138/92   Pulse (!) 104   Temp 98.2 F (36.8 C)  Resp 16   Ht 1.702 m (5\' 7" )   Wt 76.7 kg   SpO2 96%   BMI 26.48 kg/m   Physical Exam Vitals signs and nursing note reviewed.  Constitutional:      General: He is not in acute distress.    Appearance: He is well-developed.  HENT:     Head: Normocephalic and atraumatic.     Right Ear: External ear normal.     Left Ear: External ear normal.  Eyes:     General: No scleral icterus.       Right eye: No discharge.        Left eye: No discharge.     Conjunctiva/sclera: Conjunctivae normal.  Neck:     Musculoskeletal: Normal range of motion and neck supple. No neck rigidity or muscular tenderness.     Vascular: No carotid bruit.     Trachea: No tracheal deviation.     Comments: No appreciable mass on exam Cardiovascular:      Rate and Rhythm: Normal rate.  Pulmonary:     Effort: Pulmonary effort is normal. No respiratory distress.     Breath sounds: No stridor.  Abdominal:     General: There is no distension.  Musculoskeletal:        General: No swelling or deformity.  Lymphadenopathy:     Cervical: No cervical adenopathy.  Skin:    General: Skin is warm and dry.     Findings: No rash.  Neurological:     Mental Status: He is alert.     Cranial Nerves: Cranial nerve deficit: no gross deficits.      ED Treatments / Results  Labs (all labs ordered are listed, but only abnormal results are displayed) Labs Reviewed - No data to display  EKG None  Radiology Ct Angio Neck W And/or Wo Contrast  Result Date: 06/10/2018 CLINICAL DATA:  Lump in neck.  Neck mass, pulsatile. EXAM: CT ANGIOGRAPHY NECK TECHNIQUE: Multidetector CT imaging of the neck was performed using the standard protocol during bolus administration of intravenous contrast. Multiplanar CT image reconstructions and MIPs were obtained to evaluate the vascular anatomy. Carotid stenosis measurements (when applicable) are obtained utilizing NASCET criteria, using the distal internal carotid diameter as the denominator. CONTRAST:  23mL ISOVUE-370 IOPAMIDOL (ISOVUE-370) INJECTION 76% COMPARISON:  None. FINDINGS: Aortic arch: A 3 vessel arch configuration is present. No significant atherosclerotic changes are present. There is no stenosis or aneurysm. Right carotid system: The right common carotid artery is within normal limits. Bifurcation is unremarkable. Cervical right ICA is normal. Left carotid system: The left common carotid artery is within normal limits. Bifurcation is unremarkable. Cervical left ICA is normal. Vertebral arteries: The vertebral arteries are codominant. Both vertebral arteries originate from the subclavian arteries. There is no significant stenosis of either vertebral artery in the neck. PICA origins are visualized and normal.  Vertebrobasilar junction is normal. Skeleton: Vertebral body heights alignment are maintained. No focal lytic or blastic lesions are present. Other neck: The left facial artery crosses the submandibular gland near the area marked in the left neck. No other mass lesion is present. Asymmetric left jugulodigastric lymph node is noted. Upper chest: Lung apices are clear.  Thoracic inlet is normal. IMPRESSION: 1. Normal vasculature of the neck. No focal stenosis, aneurysm, or vascular malformation. 2. Mild prominence of jugulodigastric lymph nodes, left greater than right. These are within normal limits. Electronically Signed   By: Marin Roberts M.D.   On: 06/10/2018 18:03  Procedures Procedures (including critical care time)  Medications Ordered in ED Medications  iopamidol (ISOVUE-370) 76 % injection (has no administration in time range)  iopamidol (ISOVUE-370) 76 % injection 50 mL (50 mLs Intravenous Contrast Given 06/10/18 1734)     Initial Impression / Assessment and Plan / ED Course  I have reviewed the triage vital signs and the nursing notes.  Pertinent labs & imaging results that were available during my care of the patient were reviewed by me and considered in my medical decision making (see chart for details).     Patient presents with persistent neck pain and throbbing.  Patient feels like he has a pulsatile mass.  I do not appreciate it on my exam.  Patient however states he definitely feels it.  He is concerned because it is getting worse and he does have some facial numbness.  He previously had a CT scan of his head.  We will perform a CT scan of his neck with contrast to evaluate for any vascular abnormality.  CT scan does not show any acute abnormality.  I discussed the findings with the patient and his mother.  No clear etiology for symptoms.  There may be an anxiety component.   outpatient follow-up with his primary doctor  Final Clinical Impressions(s) / ED Diagnoses    Final diagnoses:  Neck pain    ED Discharge Orders    None       Linwood Dibbles, MD 06/10/18 [phone]

## 2018-06-10 NOTE — ED Triage Notes (Signed)
PT reports a discomfort on left side of neck that is constant. Pt reports he thinks he has a blood clot . Pt reports numbness to Lt ear lobe and feels that is also a sign he has a blood clot in his neck. Pt denies any injury to lt side of neck

## 2018-06-10 NOTE — ED Notes (Signed)
Patient transported to CT 

## 2018-06-14 ENCOUNTER — Ambulatory Visit (AMBULATORY_SURGERY_CENTER): Payer: Commercial Managed Care - PPO | Admitting: Gastroenterology

## 2018-06-14 ENCOUNTER — Encounter: Payer: Self-pay | Admitting: Gastroenterology

## 2018-06-14 ENCOUNTER — Other Ambulatory Visit: Payer: Self-pay

## 2018-06-14 VITALS — BP 104/66 | HR 56 | Temp 98.6°F | Resp 16

## 2018-06-14 DIAGNOSIS — K209 Esophagitis, unspecified without bleeding: Secondary | ICD-10-CM

## 2018-06-14 DIAGNOSIS — K449 Diaphragmatic hernia without obstruction or gangrene: Secondary | ICD-10-CM

## 2018-06-14 DIAGNOSIS — K210 Gastro-esophageal reflux disease with esophagitis: Secondary | ICD-10-CM

## 2018-06-14 DIAGNOSIS — K920 Hematemesis: Secondary | ICD-10-CM | POA: Diagnosis not present

## 2018-06-14 MED ORDER — OMEPRAZOLE 40 MG PO CPDR: 40.0000 mg | DELAYED_RELEASE_CAPSULE | Freq: Two times a day (BID) | ORAL | 3 refills | Status: DC

## 2018-06-14 NOTE — Patient Instructions (Signed)
Stop Famotidine and start Omeprazole 40 mg twice daily for 3 months. Repeat upper endoscopy in 2 months to check healing. Please read handouts provided.       YOU HAD AN ENDOSCOPIC PROCEDURE TODAY AT THE [facility]:   Refer to the procedure report that was given to you for any specific questions about what was found during the examination.  If the procedure report does not answer your questions, please call your gastroenterologist to clarify.  If you requested that your care partner not be given the details of your procedure findings, then the procedure report has been included in a sealed envelope for you to review at your convenience later.  YOU SHOULD EXPECT: Some feelings of bloating in the abdomen. Passage of more gas than usual.  Walking can help get rid of the air that was put into your GI tract during the procedure and reduce the bloating. If you had a lower endoscopy (such as a colonoscopy or flexible sigmoidoscopy) you may notice spotting of blood in your stool or on the toilet paper. If you underwent a bowel prep for your procedure, you may not have a normal bowel movement for a few days.  Please Note:  You might notice some irritation and congestion in your nose or some drainage.  This is from the oxygen used during your procedure.  There is no need for concern and it should clear up in a day or so.  SYMPTOMS TO REPORT IMMEDIATELY:     Following upper endoscopy (EGD)  Vomiting of blood or coffee ground material  New chest pain or pain under the shoulder blades  Painful or persistently difficult swallowing  New shortness of breath  Fever of 100F or higher  Black, tarry-looking stools  For urgent or emergent issues, a gastroenterologist can be reached at any hour by calling [phone].   DIET:  We do recommend a small meal at first, but then you may proceed to your regular diet.  Drink plenty of fluids but you should avoid alcoholic beverages for 24  hours.  ACTIVITY:  You should plan to take it easy for the rest of today and you should NOT DRIVE or use heavy machinery until tomorrow (because of the sedation medicines used during the test).    FOLLOW UP: Our staff will call the number listed on your records the next business day following your procedure to check on you and address any questions or concerns that you may have regarding the information given to you following your procedure. If we do not reach you, we will leave a message.  However, if you are feeling well and you are not experiencing any problems, there is no need to return our call.  We will assume that you have returned to your regular daily activities without incident.  If any biopsies were taken you will be contacted by phone or by letter within the next 1-3 weeks.  Please call us at [phone] if you have not heard about the biopsies in 3 weeks.    SIGNATURES/CONFIDENTIALITY: You and/or your care partner have signed paperwork which will be entered into your electronic medical record.  These signatures attest to the fact that that the information above on your After Visit Summary has been reviewed and is understood.  Full responsibility of the confidentiality of this discharge information lies with you and/or your care-partner.

## 2018-06-14 NOTE — Progress Notes (Signed)
PT taken to PACU. Monitors in place. VSS. Report given to RN. 

## 2018-06-14 NOTE — Progress Notes (Signed)
LATE ENTRY [phone]-- Epic computer system down, paper charting was instituted, pt denies changes to medical hx or medications since visit with Gunnar Fusi NP in the office last week. Pt's VS were taken prior to procedure and were stable, written on paper and placed with the chart but paper was discarded in recovery.

## 2018-06-14 NOTE — Op Note (Addendum)
[facility] Patient Name: Victor [name] Procedure Date: 06/14/2018 10:51 AM MRN: [id number] Endoscopist: Justice Britain , MD Age: 28 Referring MD:  Date of Birth: 28-Aug-1990 Gender: Male Account #: [id number] Procedure:                Upper GI endoscopy Indications:              Heartburn, Suspected reflux esophagitis, Hematemesis Medicines:                Monitored Anesthesia Care Procedure:                Pre-Anesthesia Assessment:                           - Prior to the procedure, a History and Physical                            was performed, and patient medications and                            allergies were reviewed. The patient's tolerance of                            previous anesthesia was also reviewed. The risks                            and benefits of the procedure and the sedation                            options and risks were discussed with the patient.                            All questions were answered, and informed consent                            was obtained. Prior Anticoagulants: The patient has                            taken no previous anticoagulant or antiplatelet                            agents. ASA Grade Assessment: I - A normal, healthy                            patient. After reviewing the risks and benefits,                            the patient was deemed in satisfactory condition to                            undergo the procedure.                           After obtaining informed consent, the endoscope was  passed under direct vision. Throughout the                            procedure, the patient's blood pressure, pulse, and                            oxygen saturations were monitored continuously. The                            Endoscope was introduced through the mouth, and                            advanced to the second part of duodenum. The upper                            GI endoscopy  was accomplished without difficulty.                            The patient tolerated the procedure. Scope In: Scope Out: Findings:                 No gross lesions were noted in the proximal                            esophagus and in the mid esophagus.                           LA Grade C (one or more mucosal breaks continuous                            between tops of 2 or more mucosal folds, less than                            75% circumference) esophagitis with no bleeding was                            found in the distal esophagus (this extended from                            38 cm to 35 cm).                           A 2 cm hiatal hernia was found. The proximal extent                            of the gastric folds (end of tubular esophagus) was                            38 cm from the incisors. The hiatal narrowing was                            40 cm from the incisors. The Z-line was 38 cm from  the incisors.                           The gastroesophageal flap valve was visualized                            endoscopically and classified as Hill Grade III                            (minimal fold, loose to endoscope, hiatal hernia                            likely).                           No gross lesions were noted in the entire examined                            stomach.                           No gross lesions were noted in the duodenal bulb,                            in the first portion of the duodenum and in the                            second portion of the duodenum. Complications:            No immediate complications. Estimated Blood Loss:     Estimated blood loss was minimal. Estimated blood                            loss: none. Impression:               - No gross lesions in proximal/middle esophagus. LA                            Grade C esophagitis in distal esophagus.                           - 2 cm hiatal hernia.  Gastroesophageal flap valve                            classified as Hill Grade III (minimal fold, loose                            to endoscope, hiatal hernia likely).                           - No gross lesions in the stomach.                           - No gross lesions in the duodenal bulb, in the  first portion of the duodenum and in the second                            portion of the duodenum. Recommendation:           - The patient will be observed post-procedure,                            until all discharge criteria are met.                           - Discharge patient to home.                           - Patient has a contact number available for                            emergencies. The signs and symptoms of potential                            delayed complications were discussed with the                            patient. Return to normal activities tomorrow.                            Written discharge instructions were provided to the                            patient.                           - Resume previous diet.                           - Stop Famotidine and start Omeprazole 40 mg BID x                            32-month.                           - Repeat upper endoscopy in 2 months to check                            healing.                           - If healed will then proceed with decreasing dose                            to once daily.                           - Will discuss in future possible role of other  therapies should patient desire other methods of                            GERD treatment that are non-pharmacologic.                           - The findings and recommendations were discussed                            with the patient.                           - The findings and recommendations were discussed                            with the patient's family. Justice Britain,  MD 06/14/2018 11:13:11 AM

## 2018-06-15 ENCOUNTER — Telehealth: Payer: Self-pay

## 2018-06-15 NOTE — Telephone Encounter (Signed)
Left message

## 2018-06-15 NOTE — Telephone Encounter (Signed)
Attempted to reach pt. With follow-up call following endoscopic procedure 06/14/2018.  LM on pt. Voice mail to call if he has any questions or concerns.

## 2018-06-18 DIAGNOSIS — R29818 Other symptoms and signs involving the nervous system: Secondary | ICD-10-CM | POA: Diagnosis not present

## 2018-06-20 ENCOUNTER — Other Ambulatory Visit: Payer: Self-pay | Admitting: Family Medicine

## 2018-06-20 ENCOUNTER — Ambulatory Visit
Admission: RE | Admit: 2018-06-20 | Discharge: 2018-06-20 | Disposition: A | Discharge status: Home / Self Care | Payer: Commercial Managed Care - PPO | Source: Ambulatory Visit | Attending: Family Medicine | Admitting: Family Medicine

## 2018-06-20 DIAGNOSIS — R221 Localized swelling, mass and lump, neck: Secondary | ICD-10-CM | POA: Diagnosis not present

## 2018-06-20 DIAGNOSIS — R29818 Other symptoms and signs involving the nervous system: Secondary | ICD-10-CM

## 2018-06-21 ENCOUNTER — Other Ambulatory Visit: Payer: Self-pay | Admitting: Psychiatry

## 2018-06-22 ENCOUNTER — Other Ambulatory Visit: Payer: Commercial Managed Care - PPO

## 2018-07-09 ENCOUNTER — Telehealth: Payer: Self-pay

## 2018-07-09 ENCOUNTER — Other Ambulatory Visit: Payer: Self-pay | Admitting: Nurse Practitioner

## 2018-07-09 DIAGNOSIS — R7989 Other specified abnormal findings of blood chemistry: Secondary | ICD-10-CM

## 2018-07-09 DIAGNOSIS — R945 Abnormal results of liver function studies: Secondary | ICD-10-CM

## 2018-07-09 NOTE — Telephone Encounter (Signed)
Spoke with the patient. He agrees to come get labs drawn. He will come to the lab on Friday 07/13/18.

## 2018-07-09 NOTE — Telephone Encounter (Signed)
-----   Message from Meredith Pel, NP sent at 07/09/2018  2:59 PM EDT ----- Victor [name] please let patient know that his liver test should be rechecked again to make sure they are getting back to normal.  If they are normalizing he is going to need further work-up.  I entered liver test orders in epic.  Please see if he can come in about a week.  Thanks PG

## 2018-07-12 ENCOUNTER — Other Ambulatory Visit: Payer: Self-pay | Admitting: Psychiatry

## 2018-08-06 DIAGNOSIS — Z0000 Encounter for general adult medical examination without abnormal findings: Secondary | ICD-10-CM | POA: Diagnosis not present

## 2018-08-06 DIAGNOSIS — Z13220 Encounter for screening for lipoid disorders: Secondary | ICD-10-CM | POA: Diagnosis not present

## 2018-08-09 ENCOUNTER — Ambulatory Visit: Payer: Self-pay | Admitting: Neurology

## 2018-08-22 ENCOUNTER — Telehealth: Payer: Self-pay | Admitting: Psychiatry

## 2018-08-22 ENCOUNTER — Other Ambulatory Visit: Payer: Self-pay

## 2018-08-22 MED ORDER — ALPRAZOLAM 0.5 MG PO TABS: ORAL_TABLET | ORAL | 0 refills | Status: DC

## 2018-08-22 MED ORDER — AMPHETAMINE-DEXTROAMPHET ER 25 MG PO CP24: 25.0000 mg | ORAL_CAPSULE | ORAL | 0 refills | Status: DC

## 2018-08-22 MED ORDER — GABAPENTIN 100 MG PO CAPS: ORAL_CAPSULE | ORAL | 0 refills | Status: DC

## 2018-08-22 MED ORDER — AMPHETAMINE-DEXTROAMPHETAMINE 10 MG PO TABS: 10.0000 mg | ORAL_TABLET | Freq: Every day | ORAL | 0 refills | Status: DC

## 2018-08-22 NOTE — Telephone Encounter (Signed)
Pended for approval.

## 2018-08-22 NOTE — Telephone Encounter (Signed)
Pt called to schedule follow up. Previous pt of [facility]. Next appt 6/5 with J.Carter. Request refill on Adderall 10 mg and Adderall XR 25 mg also needs refill on Gabapentin and Xanax. [facility] [address]

## 2018-08-30 ENCOUNTER — Ambulatory Visit: Payer: Commercial Managed Care - PPO | Admitting: Psychiatry

## 2018-09-07 ENCOUNTER — Ambulatory Visit (INDEPENDENT_AMBULATORY_CARE_PROVIDER_SITE_OTHER): Payer: Commercial Managed Care - PPO | Admitting: Psychiatry

## 2018-09-07 ENCOUNTER — Encounter: Payer: Self-pay | Admitting: Psychiatry

## 2018-09-07 ENCOUNTER — Ambulatory Visit: Payer: Commercial Managed Care - PPO | Admitting: Psychiatry

## 2018-09-07 DIAGNOSIS — F909 Attention-deficit hyperactivity disorder, unspecified type: Secondary | ICD-10-CM | POA: Diagnosis not present

## 2018-09-07 DIAGNOSIS — F411 Generalized anxiety disorder: Secondary | ICD-10-CM

## 2018-09-07 MED ORDER — AMPHETAMINE-DEXTROAMPHETAMINE 10 MG PO TABS: 10.0000 mg | ORAL_TABLET | Freq: Every day | ORAL | 0 refills | Status: DC | PRN

## 2018-09-07 MED ORDER — AMPHETAMINE-DEXTROAMPHET ER 25 MG PO CP24: 25.0000 mg | ORAL_CAPSULE | ORAL | 0 refills | Status: DC

## 2018-09-07 MED ORDER — ALPRAZOLAM 0.5 MG PO TABS: ORAL_TABLET | ORAL | 2 refills | Status: DC

## 2018-09-07 NOTE — Progress Notes (Signed)
Victor [name] [id number] 10/16/1990 28 y.o.  Virtual Visit via Telephone Note  I connected with pt on 09/07/18 at 12:30 PM EDT by telephone and verified that I am speaking with the correct person using two identifiers.   I discussed the limitations, risks, security and privacy concerns of performing an evaluation and management service by telephone and the availability of in person appointments. I also discussed with the patient that there may be a patient responsible charge related to this service. The patient expressed understanding and agreed to proceed.   I discussed the assessment and treatment plan with the patient. The patient was provided an opportunity to ask questions and all were answered. The patient agreed with the plan and demonstrated an understanding of the instructions.   The patient was advised to call back or seek an in-person evaluation if the symptoms worsen or if the condition fails to improve as anticipated.  I provided 15 minutes of non-face-to-face time during this encounter.  The patient was located at home.  The provider was located at [facility].   Corie Chiquito, PMHNP   Subjective:   Patient ID:  Victor [name] is a 28 y.o. (DOB 1990-05-02) male.  Chief Complaint:  Chief Complaint  Patient presents with  . ADD    HPI Victor [name] presents for follow-up of ADD. He reports that his concentration has been well controlled with medications. Reports that he had some anxiety with some unexplained health issues and this improved with re-assurance from medical providers and tests. Reports mood has been "pretty positive." Reports sleeping 5-6 hours a night which his baseline. Reports sleeping more on weekends. Appetite has been good. Cooking more during pandemic. Energy and motivation have been good. Denies SI.   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   Reports that he has not needed Gabapentin recently and has  not been taking it. Reports alprazolam prn about 1-3 times a week.   Reports that he takes Adderall IR on an as needed basis when working longer shifts.    Review of Systems:  Review of Systems  Cardiovascular: Negative for palpitations.  Musculoskeletal: Negative for gait problem.  Neurological: Negative for tremors.       He reports occ face tingling.   Psychiatric/Behavioral:       Please refer to HPI    He reports that he has apt with neurologist later this month.   Medications: I have reviewed the patient's current medications.  Current Outpatient Medications  Medication Sig Dispense Refill  . ALPRAZolam (XANAX) 0.5 MG tablet TAKE ONE-HALF-1 TABLET EVERY DAY AS NEEDED FOR PANIC ATTACKS 30 tablet 2  . [START ON 09/19/2018] amphetamine-dextroamphetamine (ADDERALL XR) 25 MG 24 hr capsule Take 1 capsule by mouth every morning. 30 capsule 0  . [START ON 09/19/2018] amphetamine-dextroamphetamine (ADDERALL) 10 MG tablet Take 1 tablet (10 mg total) by mouth daily as needed. 30 tablet 0  . omeprazole (PRILOSEC) 40 MG capsule Take 1 capsule (40 mg total) by mouth 2 (two) times daily. Take 30 minutes before meals twice daily. 60 capsule 3  . [START ON 10/17/2018] amphetamine-dextroamphetamine (ADDERALL XR) 25 MG 24 hr capsule Take 1 capsule by mouth every morning. 30 capsule 0  . [START ON 11/14/2018] amphetamine-dextroamphetamine (ADDERALL XR) 25 MG 24 hr capsule Take 1 capsule by mouth every morning. 30 capsule 0  . [START ON 10/17/2018] amphetamine-dextroamphetamine (ADDERALL) 10 MG tablet Take 1 tablet (10 mg total) by mouth daily as  needed for up to 30 days. 30 tablet 0  . [START ON 11/14/2018] amphetamine-dextroamphetamine (ADDERALL) 10 MG tablet Take 1 tablet (10 mg total) by mouth daily as needed. 30 tablet 0  . gabapentin (NEURONTIN) 100 MG capsule TAKE 2 CAPSULES BY MOUTH AS NEEDED FOR ANXIETY LIMIT A MAXIMUM DAILY DOSE OF 6 CAPSULES (Patient not taking: Reported on 09/07/2018) 120 capsule 0   . ondansetron (ZOFRAN ODT) 8 MG disintegrating tablet Take 1 tablet (8 mg total) by mouth every 8 (eight) hours as needed for nausea or vomiting. (Patient not taking: Reported on 09/07/2018) 12 tablet 0   No current facility-administered medications for this visit.     Medication Side Effects: None  Allergies: No Known Allergies  Past Medical History:  Diagnosis Date  . ADHD (attention deficit hyperactivity disorder)   . Anxiety   . Panic attack     Family History  Problem Relation Age of Onset  . Colon cancer Neg Hx   . Esophageal cancer Neg Hx   . Rectal cancer Neg Hx   . Stomach cancer Neg Hx     Social History   Socioeconomic History  . Marital status: Single    Spouse name: n/a  . Number of children: 0  . Years of education: Associates  . Highest education level: Not on file  Occupational History  . Occupation: Chief Executive Officer  Social Needs  . Financial resource strain: Not on file  . Food insecurity:    Worry: Not on file    Inability: Not on file  . Transportation needs:    Medical: Not on file    Non-medical: Not on file  Tobacco Use  . Smoking status: Current Every Day Smoker    Packs/day: 0.01    Types: Cigarettes    Last attempt to quit: 04/01/2015    Years since quitting: 3.4  . Smokeless tobacco: Never Used  Substance and Sexual Activity  . Alcohol use: Yes    Alcohol/week: 0.0 standard drinks    Comment: 3 beers a night.   . Drug use: Yes    Types: Cocaine  . Sexual activity: Not on file  Lifestyle  . Physical activity:    Days per week: Not on file    Minutes per session: Not on file  . Stress: Not on file  Relationships  . Social connections:    Talks on phone: Not on file    Gets together: Not on file    Attends religious service: Not on file    Active member of club or organization: Not on file    Attends meetings of clubs or organizations: Not on file    Relationship status: Not on file  . Intimate partner violence:     Fear of current or ex partner: Not on file    Emotionally abused: Not on file    Physically abused: Not on file    Forced sexual activity: Not on file  Other Topics Concern  . Not on file  Social History Narrative  . Not on file    Past Medical History, Surgical history, Social history, and Family history were reviewed and updated as appropriate.   Please see review of systems for further details on the patient's review from today.   Objective:   Physical Exam:  There were no vitals taken for this visit.  Physical Exam Neurological:     Mental Status: He is alert and oriented to person, place, and time.  Cranial Nerves: No dysarthria.  Psychiatric:        Attention and Perception: Attention normal.        Mood and Affect: Mood normal.        Speech: Speech normal.        Behavior: Behavior is cooperative.        Thought Content: Thought content normal. Thought content is not paranoid or delusional. Thought content does not include homicidal or suicidal ideation. Thought content does not include homicidal or suicidal plan.        Cognition and Memory: Cognition and memory normal.        Judgment: Judgment normal.     Lab Review:     Component Value Date/Time   NA 140 06/06/2018 1726   K 4.0 06/06/2018 1726   CL 106 06/06/2018 1726   CO2 24 06/06/2018 1726   GLUCOSE 143 (H) 06/06/2018 1726   BUN 8 06/06/2018 1726   CREATININE 0.91 06/06/2018 1726   CALCIUM 9.3 06/06/2018 1726   PROT 7.2 06/06/2018 1726   ALBUMIN 4.7 06/06/2018 1726   AST 48 (H) 06/06/2018 1726   ALT 47 (H) 06/06/2018 1726   ALKPHOS 45 06/06/2018 1726   BILITOT 0.8 06/06/2018 1726   GFRNONAA >60 06/06/2018 1726   GFRAA >60 06/06/2018 1726       Component Value Date/Time   WBC 6.7 06/06/2018 1726   RBC 4.51 06/06/2018 1726   HGB 13.5 06/06/2018 1726   HCT 40.2 06/06/2018 1726   PLT 319 06/06/2018 1726   MCV 89.1 06/06/2018 1726   MCH 29.9 06/06/2018 1726   MCHC 33.6 06/06/2018 1726    RDW 12.9 06/06/2018 1726   LYMPHSABS 2.4 06/06/2018 1726   MONOABS 0.5 06/06/2018 1726   EOSABS 0.2 06/06/2018 1726   BASOSABS 0.1 06/06/2018 1726    No results found for: POCLITH, LITHIUM   No results found for: PHENYTOIN, PHENOBARB, VALPROATE, CBMZ   .res Assessment: Plan:   Continue Adderall XR 25 mg p.o. every morning for attention deficit. Continue Adderall 10 mg daily as needed for attention deficit. Continue alprazolam 0.5 mg 1/2-1 tab daily as needed panic attacks. Patient to follow-up in 3 months or sooner if clinically indicated.  Attention deficit hyperactivity disorder (ADHD), unspecified ADHD type - Plan: amphetamine-dextroamphetamine (ADDERALL XR) 25 MG 24 hr capsule, amphetamine-dextroamphetamine (ADDERALL) 10 MG tablet, amphetamine-dextroamphetamine (ADDERALL) 10 MG tablet, amphetamine-dextroamphetamine (ADDERALL XR) 25 MG 24 hr capsule, amphetamine-dextroamphetamine (ADDERALL XR) 25 MG 24 hr capsule, amphetamine-dextroamphetamine (ADDERALL) 10 MG tablet  Anxiety state - Plan: ALPRAZolam (XANAX) 0.5 MG tablet  Please see After Visit Summary for patient specific instructions.  Future Appointments  Date Time Provider Department Center  09/25/2018  2:30 PM Anson FretAhern, Antonia B, MD [facility] None    No orders of the defined types were placed in this encounter.     -------------------------------

## 2018-09-19 ENCOUNTER — Encounter: Payer: Self-pay | Admitting: Gastroenterology

## 2018-09-20 ENCOUNTER — Telehealth: Payer: Self-pay | Admitting: *Deleted

## 2018-09-20 NOTE — Telephone Encounter (Signed)
Spoke with pt. He is comfortable coming into office for 2:30 appt on Tues 6/23. He passed COVID19 screening questions. He will bring his own mask. He understands the check-in process.

## 2018-09-25 ENCOUNTER — Encounter: Payer: Self-pay | Admitting: Neurology

## 2018-09-25 ENCOUNTER — Ambulatory Visit (INDEPENDENT_AMBULATORY_CARE_PROVIDER_SITE_OTHER): Payer: Commercial Managed Care - PPO | Admitting: Neurology

## 2018-09-25 ENCOUNTER — Other Ambulatory Visit: Payer: Self-pay

## 2018-09-25 VITALS — BP 136/91 | HR 97 | Temp 98.2°F | Ht 68.0 in | Wt 177.0 lb

## 2018-09-25 DIAGNOSIS — R221 Localized swelling, mass and lump, neck: Secondary | ICD-10-CM

## 2018-09-25 DIAGNOSIS — R200 Anesthesia of skin: Secondary | ICD-10-CM

## 2018-09-25 DIAGNOSIS — R202 Paresthesia of skin: Secondary | ICD-10-CM

## 2018-09-25 DIAGNOSIS — R220 Localized swelling, mass and lump, head: Secondary | ICD-10-CM

## 2018-09-25 DIAGNOSIS — M542 Cervicalgia: Secondary | ICD-10-CM

## 2018-09-25 DIAGNOSIS — L049 Acute lymphadenitis, unspecified: Secondary | ICD-10-CM | POA: Diagnosis not present

## 2018-09-25 DIAGNOSIS — R29898 Other symptoms and signs involving the musculoskeletal system: Secondary | ICD-10-CM | POA: Diagnosis not present

## 2018-09-25 NOTE — Patient Instructions (Addendum)
MRI brain w/wo contrast as well as of the soft tissue of the neck   Cerebral Angiogram  A cerebral angiogram is a procedure that is used to examine the blood vessels in the brain and neck. In this procedure, contrast dye is injected through a long, thin tube (catheter) into an artery. X-rays are then taken, which show if there is a blockage or problem in a blood vessel. Tell a health care provider about:  Any allergies you have, including allergies to shellfish, contrast dye, or iodine  All medicines you are taking, including vitamins, herbs, eye drops, creams, and over-the-counter medicines.  Any problems you or family members have had with anesthetic medicines.  Any blood disorders you have.  Any surgeries you have had.  Any medical conditions you have.  Whether you are pregnant or may be pregnant.  Whether you are currently breastfeeding. What are the risks? Generally, this is a safe procedure. However, problems may occur, including:  Damage to surrounding nerves, tissues, or structures.  Blood clot.  Inability to remember what happened (amnesia). This is usually temporary.  Weakness, numbness, speech, or vision problems. This is usually temporary.  Stroke.  Kidney injury.  Bleeding or bruising.  Allergic reaction medicines or dyes.  Infection. What happens before the procedure? Staying hydrated Follow instructions from your health care provider about hydration, which may include:  Up to 2 hours before the procedure - you may continue to drink clear liquids, such as water, clear fruit juice, black coffee, and plain tea. Eating and drinking restrictions Follow instructions from your health care provider about eating and drinking, which may include:  8 hours before the procedure - stop eating heavy meals or foods such as meat, fried foods, or fatty foods.  6 hours before the procedure - stop eating light meals or foods, such as toast or cereal.  6 hours before  the procedure - stop drinking milk or drinks that contain milk.  2 hours before the procedure - stop drinking clear liquids. General instructions  Ask your health care provider about: ? Changing or stopping your regular medicines. This is especially important if you are taking diabetes medicines or blood thinners. ? Taking medicines such as aspirin or ibuprofen. These medicines can thin your blood. Do not take these medicines before your procedure if your health care provider asks you not to.  You may have blood tests done.  Plan to have someone take you home from the hospital or clinic.  If you will be going home right after the procedure, plan to have someone with you for 24 hours. What happens during the procedure?  To reduce your risk of infection: ? Your health care team will wash or sanitize their hands. ? Your skin will be washed with soap. ? Hair may be removed from the surgical area.  You will lie on your back on an imaging bed with an X-ray machine around you.  Your head will be secured to the bed with a strap or device to help you keep still.  An IV tube will be inserted into one of your veins.  You will be given one or more of the following: ? A medicine to help you relax (sedative). ? A medicine to numb the area (local anesthetic) where the catheter will be inserted. This is usually your groin, leg, or arm.  Your heart rate and other vital signs will be watched carefully. You may have electrodes placed on your chest.  A small cut (incision)  will be made where the catheter will be inserted.  The catheter will be inserted into an artery that leads to the head. You may feel slight pressure.  The catheter will be moved through the body up to your neck and brain. X-ray images will help your health care provider bring the catheter to the correct location.  The dye will be injected into the catheter and will travel to your head or neck area. You may feel a warming or  burning sensation or notice a strange taste in your mouth as the dye goes through your system.  Images will be taken to show how the dye flows through the area.  While the images are being taken, you may be given instructions on breathing, swallowing, moving, or talking.  When the images are finished, the catheter will be slowly removed.  Pressure will be applied to the skin to stop any bleeding. A tight bandage (dressing) or seal will be applied to the skin.  Your IV will be removed. The procedure may vary among health care providers and hospitals. What happens after the procedure?  Your blood pressure, heart rate, breathing rate, and blood oxygen level will be monitored until the medicines you were given have worn off.  You will be asked to lie flat for several hours. The arm or leg where the catheter was inserted will need to be kept straight while you are in the recovery room.  The insertion site will be watched for bleeding and you will be checked often.  You will be instructed to drink plenty of fluids. This will help wash the contrast dye out of your system.  Do not drive for 24 hours if you received a sedative.  It is up to you to get the results of your procedure. Ask your health care provider, or the department that is doing the procedure, when your results will be ready. Summary  A cerebral angiogram is a procedure that is used to examine the blood vessels in the brain and neck.  In this procedure, contrast dye is injected through a long, thin tube (catheter) into an artery. X-rays are then taken, which show if there is a blockage or problem in a blood vessel.  You will be given a sedative to help you relax during the procedure. A local anesthetic will be used to numb the area where the catheter is inserted. You may feel pressure when the catheter is inserted, and you may feel a warm sensation when the dye is injected.  After the procedure, you will be asked to lie flat  for several hours. The arm or leg where the catheter was inserted will need to be kept straight while you are in the recovery room. This information is not intended to replace advice given to you by your health care provider. Make sure you discuss any questions you have with your health care provider. Document Released: 08/05/2013 Document Revised: 04/25/2016 Document Reviewed: 04/25/2016 Elsevier Interactive Patient Education  2019 [employer].

## 2018-09-25 NOTE — Progress Notes (Signed)
[facility]    Provider:  Dr Lucia GaskinsAhern Requesting Provider: Daisy Florooss, Charles Alan, MD Primary Care Provider:  Daisy Florooss, Charles Alan, MD  CC:  Facial and neck numbness  HPI:  Victor [name] is a 28 y.o. male here as requested by Daisy Florooss, Charles Alan, MD for elft-sided unusual symptoms.. Started in February he started noticing the artery felt inflamed like a a cherry was under his skin. He had this overwhelming tingling sensation in his face in the cheeks, more on the left but on both sides, felt pins and needles all down his neck and on the left side of his face. He googled and became freaked out, would happen randomly. He feels pulsating in the neck, swelling in the neck, stiffness in the neck and generally in the neck. The facial tingling is not the primary issue and doesn't always happen with the symptoms, the symptoms are really feeling of swelling and pulsating in the neck. It can be scary. Not painful but uncomfortable. He can see his carotid pulsating. It can look swollen to him. Been happening randomly, 2-3 weeks ago. Can last a few hours, then calms down. No difficulty talking or swallowing when this occurs, no chest pain, palpitations. He feels uncomfrotable in his neck. He had the tingling several times. No tingling now, last 2-3 weeks ago. Primarily on the left but on both sides. No vasular issues, no known autoimmune disorders, EKG was fine. No joint pain, no dry eyes. No other focal neurologic symptoms. He still feels uncomfortable in the neck. No other focal neurologic deficits, associated symptoms, inciting events or modifiable factors.  Reviewed notes, labs and imaging from outside physicians, which showed:  CTA neck: reviewed report, unremarkable  Reviewed US carotid report: No explanation for patient's left-sided facial numbness and neck swelling.  CT head 06/06/2018: showed No acute intracranial abnormalities including mass lesion or mass effect, hydrocephalus, extra-axial  fluid collection, midline shift, hemorrhage, or acute infarction, large ischemic events (personally reviewed images)  I reviewed Dr. Charlott Rakesoss's notes, patient with unusual left facial symptoms to ER trips and imaging.  He is on Adderall, lorazepam and and gabapentin.  I reviewed emergency room notes.  Patient presented for discomfort and swelling in the neck.  Numbness on the left side of his face, associated discomfort in his left neck.  He has been to the emergency room twice.  He reported symptoms getting worse.  Certain positions he feels a pulsating throbbing mass in the left side of his neck.  Feels like he is getting worse and concerned he might have any aneurysm.  He has a history of anxiety and panic attack.  Feels like he has a pulsatile mass.  Dr. Tenny Crawoss did not appreciated on exam.  Is getting worse with some facial numbness.  He has had CT of the head, CTA of the head and ultrasound of the neck.  No clear etiology for symptoms.  It may be an anxiety component.   Review of Systems: Patient complains of symptoms per HPI as well as the following symptoms: neck pain and swelling. Pertinent negatives and positives per HPI. All others negative.   Social History   Socioeconomic History  . Marital status: Single    Spouse name: n/a  . Number of children: 0  . Years of education: Associates  . Highest education level: Not on file  Occupational History  . Occupation: [employer]  Social Needs  . Financial resource strain: Not on file  . Food insecurity  Worry: Not on file    Inability: Not on file  . Transportation needs    Medical: Not on file    Non-medical: Not on file  Tobacco Use  . Smoking status: Current Every Day Smoker    Packs/day: 0.50    Types: Cigarettes    Last attempt to quit: 04/01/2015    Years since quitting: 3.4  . Smokeless tobacco: Never Used  Substance and Sexual Activity  . Alcohol use: Yes    Alcohol/week: 0.0 standard drinks    Comment:  3 beers a night.   . Drug use: Yes    Types: Cocaine    Comment: "every once in awhile"  . Sexual activity: Not on file  Lifestyle  . Physical activity    Days per week: Not on file    Minutes per session: Not on file  . Stress: Not on file  Relationships  . Social Musicianconnections    Talks on phone: Not on file    Gets together: Not on file    Attends religious service: Not on file    Active member of club or organization: Not on file    Attends meetings of clubs or organizations: Not on file    Relationship status: Not on file  . Intimate partner violence    Fear of current or ex partner: Not on file    Emotionally abused: Not on file    Physically abused: Not on file    Forced sexual activity: Not on file  Other Topics Concern  . Not on file  Social History Narrative   Lives with his girlfriend currently    Right handed   Caffeine: about 1 can soda/day    Family History  Problem Relation Age of Onset  . Colon cancer Neg Hx   . Esophageal cancer Neg Hx   . Rectal cancer Neg Hx   . Stomach cancer Neg Hx     Past Medical History:  Diagnosis Date  . Acid reflux   . ADHD (attention deficit hyperactivity disorder)   . Anxiety   . Hiatal hernia   . Panic attack     Patient Active Problem List   Diagnosis Date Noted  . Acute appendicitis 08/03/2015  . Attention deficit hyperactivity disorder (ADHD) 01/23/2015    Past Surgical History:  Procedure Laterality Date  . LAPAROSCOPIC APPENDECTOMY N/A 08/03/2015   Procedure: APPENDECTOMY LAPAROSCOPIC;  Surgeon: Manus RuddMatthew Tsuei, MD;  Location: [facility] OR;  Service: General;  Laterality: N/A;    Current Outpatient Medications  Medication Sig Dispense Refill  . ALPRAZolam (XANAX) 0.5 MG tablet TAKE ONE-HALF-1 TABLET EVERY DAY AS NEEDED FOR PANIC ATTACKS 30 tablet 2  . amphetamine-dextroamphetamine (ADDERALL XR) 25 MG 24 hr capsule Take 1 capsule by mouth every morning. 30 capsule 0  . amphetamine-dextroamphetamine (ADDERALL) 10 MG  tablet Take 1 tablet (10 mg total) by mouth daily as needed. 30 tablet 0  . omeprazole (PRILOSEC) 40 MG capsule Take 1 capsule (40 mg total) by mouth 2 (two) times daily. Take 30 minutes before meals twice daily. 60 capsule 3  . [START ON 10/17/2018] amphetamine-dextroamphetamine (ADDERALL XR) 25 MG 24 hr capsule Take 1 capsule by mouth every morning. 30 capsule 0  . [START ON 11/14/2018] amphetamine-dextroamphetamine (ADDERALL XR) 25 MG 24 hr capsule Take 1 capsule by mouth every morning. 30 capsule 0  . [START ON 10/17/2018] amphetamine-dextroamphetamine (ADDERALL) 10 MG tablet Take 1 tablet (10 mg total) by mouth daily as needed for up to 30  days. 30 tablet 0  . [START ON 11/14/2018] amphetamine-dextroamphetamine (ADDERALL) 10 MG tablet Take 1 tablet (10 mg total) by mouth daily as needed. 30 tablet 0  . gabapentin (NEURONTIN) 100 MG capsule TAKE 2 CAPSULES BY MOUTH AS NEEDED FOR ANXIETY LIMIT A MAXIMUM DAILY DOSE OF 6 CAPSULES (Patient not taking: Reported on 09/07/2018) 120 capsule 0   No current facility-administered medications for this visit.     Allergies as of 09/25/2018  . (No Known Allergies)    Vitals: BP (!) 136/91 (BP Location: Left Arm, Patient Position: Sitting)   Pulse 97   Temp 98.2 F (36.8 C) Comment: taken by front staff  Ht 5\' 8"  (1.727 m)   Wt 177 lb (80.3 kg)   BMI 26.91 kg/m  Last Weight:  Wt Readings from Last 1 Encounters:  09/25/18 177 lb (80.3 kg)   Last Height:   Ht Readings from Last 1 Encounters:  09/25/18 5\' 8"  (1.727 m)     Physical exam: Exam: Gen: NAD, conversant, well nourised, obese, well groomed                     CV: RRR, no MRG. No Carotid Bruits. No peripheral edema, warm, nontender Eyes: Conjunctivae clear without exudates or hemorrhage  Neuro: Detailed Neurologic Exam  Speech:    Speech is normal; fluent and spontaneous with normal comprehension.  Cognition:    The patient is oriented to person, place, and time;     recent and  remote memory intact;     language fluent;     normal attention, concentration,     fund of knowledge Cranial Nerves:    The pupils are equal, round, and reactive to light. The fundi are normal and spontaneous venous pulsations are present. Visual fields are full to finger confrontation. Extraocular movements are intact. Trigeminal sensation is intact and the muscles of mastication are normal. The face is symmetric. The palate elevates in the midline. Hearing intact. Voice is normal. Shoulder shrug is normal. The tongue has normal motion without fasciculations.   Coordination:    Normal finger to nose and heel to shin. Normal rapid alternating movements.   Gait:    Heel-toe and tandem gait are normal.   Motor Observation:    No asymmetry, no atrophy, and no involuntary movements noted. Tone:    Normal muscle tone.    Posture:    Posture is normal. normal erect    Strength:    Strength is V/V in the upper and lower limbs.      Sensation: intact to LT     Reflex Exam:  DTR's:    Deep tendon reflexes in the upper and lower extremities are normal bilaterally.   Toes:    The toes are downgoing bilaterally.   Clonus:    Clonus is absent.    Assessment/Plan: This is a 28 year old patient who continues to have left sided neck tenderness, pulse awareness, swelling, fullness.  He has had multiple tests including CTA of the neck, ultrasound which did find an enlarged lymph node right in the area he feels fullness.  It appeared benign however feel an MRI of the soft tissues of the neck to further examine to ensure no abnormal looking lymph nodes or malignancy or other soft tissue masses are causing his symptoms.  He also has left-sided numbness and tingling this could be radiating from his neck but would be unusual for the trigeminal nerve to be involved in pain in the  neck although possible so needs MRI of the brain with and without contrast to ensure he does not have stroke, demyelinating  lesion causing these symptoms, or any other intracranial etiology.  MRI of the brain w/wo contrast MRI of the soft tissue of the neck.  If negative can consider cerebral angiogram with Dr. Corliss Skainseveshwar  Orders Placed This Encounter  Procedures  . MR NECK SOFT TISSUE ONLY W WO CONTRAST  . MR BRAIN W WO CONTRAST     Cc: Daisy Florooss, Charles Alan, MD,    Naomie DeanAntonia Farley Crooker, MD  [facility] [address] [place], [place] [id number]  Phone [phone] Fax [phone]

## 2018-09-26 DIAGNOSIS — M542 Cervicalgia: Secondary | ICD-10-CM | POA: Insufficient documentation

## 2018-09-27 ENCOUNTER — Telehealth: Payer: Self-pay | Admitting: Neurology

## 2018-09-27 NOTE — Telephone Encounter (Signed)
[facility] Auth: NPR Ref # [id number] M on 09/27/18. Order sent to [facility]  They will reach out to the patient to schedule.

## 2018-10-25 ENCOUNTER — Ambulatory Visit
Admission: RE | Admit: 2018-10-25 | Discharge: 2018-10-25 | Disposition: A | Discharge status: Home / Self Care | Payer: Commercial Managed Care - PPO | Source: Ambulatory Visit | Attending: Neurology | Admitting: Neurology

## 2018-10-25 DIAGNOSIS — R200 Anesthesia of skin: Secondary | ICD-10-CM

## 2018-10-25 DIAGNOSIS — R221 Localized swelling, mass and lump, neck: Secondary | ICD-10-CM

## 2018-10-25 DIAGNOSIS — R202 Paresthesia of skin: Secondary | ICD-10-CM

## 2018-10-25 DIAGNOSIS — R29898 Other symptoms and signs involving the musculoskeletal system: Secondary | ICD-10-CM

## 2018-10-25 DIAGNOSIS — R220 Localized swelling, mass and lump, head: Secondary | ICD-10-CM

## 2018-10-25 DIAGNOSIS — M542 Cervicalgia: Secondary | ICD-10-CM

## 2018-10-25 DIAGNOSIS — L049 Acute lymphadenitis, unspecified: Secondary | ICD-10-CM

## 2018-10-25 MED ORDER — GADOBENATE DIMEGLUMINE 529 MG/ML IV SOLN
15.0000 mL | Freq: Once | INTRAVENOUS | Status: AC | PRN
  Administered 2018-10-25: 15 mL via INTRAVENOUS

## 2018-10-29 ENCOUNTER — Telehealth: Payer: Self-pay

## 2018-10-29 NOTE — Telephone Encounter (Signed)
-----   Message from Antonia B Ahern, MD sent at 10/26/2018  9:09 PM EDT ----- MRI of the neck is normal.Negative for mass, swelling, lymph nodes are normal. thanks 

## 2018-10-29 NOTE — Telephone Encounter (Signed)
Attempted to reach the pt. VM was full and was not accepting new messages at this time.

## 2018-10-30 ENCOUNTER — Telehealth: Payer: Self-pay | Admitting: *Deleted

## 2018-10-30 NOTE — Telephone Encounter (Signed)
I have spoken with the patient and he verbalized understanding of his results. 

## 2018-10-30 NOTE — Telephone Encounter (Signed)
-----   Message from Melvenia Beam, MD sent at 10/26/2018  9:08 PM EDT ----- MRI of the brain is normal. Some incidental sinus inflammation thanks

## 2018-10-30 NOTE — Telephone Encounter (Signed)
-----   Message from Melvenia Beam, MD sent at 10/26/2018  9:09 PM EDT ----- MRI of the neck is normal.Negative for mass, swelling, lymph nodes are normal. thanks

## 2018-10-30 NOTE — Telephone Encounter (Signed)
I have spoken with the patient and he verbalized understanding of the results.

## 2018-11-26 ENCOUNTER — Telehealth: Payer: Self-pay | Admitting: Psychiatry

## 2018-11-26 ENCOUNTER — Other Ambulatory Visit: Payer: Self-pay | Admitting: Gastroenterology

## 2018-11-26 NOTE — Telephone Encounter (Signed)
Rx at pharmacy already. Not needed

## 2018-12-04 ENCOUNTER — Other Ambulatory Visit: Payer: Self-pay

## 2018-12-04 ENCOUNTER — Encounter: Payer: Self-pay | Admitting: Psychiatry

## 2018-12-04 ENCOUNTER — Ambulatory Visit (INDEPENDENT_AMBULATORY_CARE_PROVIDER_SITE_OTHER): Payer: Commercial Managed Care - PPO | Admitting: Psychiatry

## 2018-12-04 ENCOUNTER — Other Ambulatory Visit: Payer: Self-pay | Admitting: Psychiatry

## 2018-12-04 VITALS — HR 96 | Wt 171.0 lb

## 2018-12-04 DIAGNOSIS — F909 Attention-deficit hyperactivity disorder, unspecified type: Secondary | ICD-10-CM

## 2018-12-04 DIAGNOSIS — F411 Generalized anxiety disorder: Secondary | ICD-10-CM

## 2018-12-04 DIAGNOSIS — F4010 Social phobia, unspecified: Secondary | ICD-10-CM

## 2018-12-04 MED ORDER — AMPHETAMINE-DEXTROAMPHETAMINE 10 MG PO TABS: 10.0000 mg | ORAL_TABLET | Freq: Every day | ORAL | 0 refills | Status: DC | PRN

## 2018-12-04 MED ORDER — ALPRAZOLAM 0.5 MG PO TABS: ORAL_TABLET | ORAL | 2 refills | Status: DC

## 2018-12-04 MED ORDER — AMPHETAMINE-DEXTROAMPHET ER 25 MG PO CP24: 25.0000 mg | ORAL_CAPSULE | ORAL | 0 refills | Status: DC

## 2018-12-04 MED ORDER — PROPRANOLOL HCL 10 MG PO TABS: ORAL_TABLET | ORAL | 1 refills | Status: DC

## 2018-12-04 NOTE — Progress Notes (Signed)
Victor [name] [id number] 07/04/1990 28 y.o.  Virtual Visit via Telephone Note  I connected with pt on 12/04/18 at  1:30 PM EDT by telephone and verified that I am speaking with the correct person using two identifiers.   I discussed the limitations, risks, security and privacy concerns of performing an evaluation and management service by telephone and the availability of in person appointments. I also discussed with the patient that there may be a patient responsible charge related to this service. The patient expressed understanding and agreed to proceed.   I discussed the assessment and treatment plan with the patient. The patient was provided an opportunity to ask questions and all were answered. The patient agreed with the plan and demonstrated an understanding of the instructions.   The patient was advised to call back or seek an in-person evaluation if the symptoms worsen or if the condition fails to improve as anticipated.  I provided 25 minutes of non-face-to-face time during this encounter.  The patient was located at home.  [facility]   Corie ChiquitoJessica Trinidad Petron, PMHNP   Subjective:   Patient ID:  Victor [name] is a 28 y.o. (DOB 07/04/1990) male.  Chief Complaint:  Chief Complaint  Patient presents with  . ADD  . Anxiety    HPI Victor Folkshomas Ledonne presents for follow-up of ADD. He reports that medication continues to be adequate for concentration. Reports that he is able to be productive and is not having any issues with distractibility. He reports occ situational anxiety that this is relieved with prn medication. He has occ panic s/s, such as panic with driving in traffic. Estimates panic attacks about once a week. He reports that he has been taking Xanax 0.5 mg prn on a daily basis since it is helpful for social anxiety. He reports that his mood has been stable. Denies depressed mood. Sleep has been adequate. Sleeping 6-7 hours. Appetite has been stable.  Energy and motivation have been adequate. Denies SI.   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 Recent beach trip.    Review of Systems:  Review of Systems  Cardiovascular: Negative for palpitations.  Gastrointestinal: Negative.   Musculoskeletal: Negative for gait problem.  Neurological: Negative for tremors.  Psychiatric/Behavioral:       Please refer to HPI    Medications: I have reviewed the patient's current medications.  Current Outpatient Medications  Medication Sig Dispense Refill  . [START ON 12/24/2018] ALPRAZolam (XANAX) 0.5 MG tablet TAKE ONE-HALF-1 TABLET EVERY DAY AS NEEDED FOR PANIC ATTACKS 30 tablet 2  . [START ON 02/18/2019] amphetamine-dextroamphetamine (ADDERALL XR) 25 MG 24 hr capsule Take 1 capsule by mouth every morning. 30 capsule 0  . [START ON 01/21/2019] amphetamine-dextroamphetamine (ADDERALL XR) 25 MG 24 hr capsule Take 1 capsule by mouth every morning. 30 capsule 0  . [START ON 12/24/2018] amphetamine-dextroamphetamine (ADDERALL XR) 25 MG 24 hr capsule Take 1 capsule by mouth every morning. 30 capsule 0  . [START ON 01/29/2019] amphetamine-dextroamphetamine (ADDERALL) 10 MG tablet Take 1 tablet (10 mg total) by mouth daily as needed. 30 tablet 0  . [START ON 01/01/2019] amphetamine-dextroamphetamine (ADDERALL) 10 MG tablet Take 1 tablet (10 mg total) by mouth daily as needed. 30 tablet 0  . ibuprofen (ADVIL) 200 MG tablet Take 200 mg by mouth every 6 (six) hours as needed.    Marland Kitchen. omeprazole (PRILOSEC) 40 MG capsule TAKE 1 CAPSULE(40 MG) BY MOUTH TWICE DAILY 30 MINUTES BEFORE MEALS 60 capsule 3  .  amphetamine-dextroamphetamine (ADDERALL) 10 MG tablet Take 1 tablet (10 mg total) by mouth daily as needed for up to 30 days. 30 tablet 0  . gabapentin (NEURONTIN) 100 MG capsule TAKE 2 CAPSULES BY MOUTH AS NEEDED FOR ANXIETY LIMIT A MAXIMUM DAILY DOSE OF 6 CAPSULES (Patient not taking: Reported on 09/07/2018) 120 capsule 0  . propranolol (INDERAL) 10 MG tablet  TAKE 1 TO 2 TABLETS BY MOUTH TWICE DAILY AS NEEDED FOR ANXIETY 360 tablet 0   No current facility-administered medications for this visit.     Medication Side Effects: None  Allergies: No Known Allergies  Past Medical History:  Diagnosis Date  . Acid reflux   . ADHD (attention deficit hyperactivity disorder)   . Anxiety   . Hiatal hernia   . Panic attack     Family History  Problem Relation Age of Onset  . Colon cancer Neg Hx   . Esophageal cancer Neg Hx   . Rectal cancer Neg Hx   . Stomach cancer Neg Hx     Social History   Socioeconomic History  . Marital status: Single    Spouse name: n/a  . Number of children: 0  . Years of education: Associates  . Highest education level: Not on file  Occupational History  . Occupation: Chief Executive Officer  Social Needs  . Financial resource strain: Not on file  . Food insecurity    Worry: Not on file    Inability: Not on file  . Transportation needs    Medical: Not on file    Non-medical: Not on file  Tobacco Use  . Smoking status: Current Every Day Smoker    Packs/day: 0.50    Types: Cigarettes    Last attempt to quit: 04/01/2015    Years since quitting: 3.6  . Smokeless tobacco: Never Used  Substance and Sexual Activity  . Alcohol use: Yes    Alcohol/week: 0.0 standard drinks    Comment: 3 beers a night.   . Drug use: Yes    Types: Cocaine    Comment: "every once in awhile"  . Sexual activity: Not on file  Lifestyle  . Physical activity    Days per week: Not on file    Minutes per session: Not on file  . Stress: Not on file  Relationships  . Social Musician on phone: Not on file    Gets together: Not on file    Attends religious service: Not on file    Active member of club or organization: Not on file    Attends meetings of clubs or organizations: Not on file    Relationship status: Not on file  . Intimate partner violence    Fear of current or ex partner: Not on file     Emotionally abused: Not on file    Physically abused: Not on file    Forced sexual activity: Not on file  Other Topics Concern  . Not on file  Social History Narrative   Lives with his girlfriend currently    Right handed   Caffeine: about 1 can soda/day    Past Medical History, Surgical history, Social history, and Family history were reviewed and updated as appropriate.   Please see review of systems for further details on the patient's review from today.   Objective:   Physical Exam:  Pulse 96   Wt 171 lb (77.6 kg)   BMI 26.00 kg/m   Physical Exam Constitutional:  General: He is not in acute distress.    Appearance: He is well-developed.  Musculoskeletal:        General: No deformity.  Neurological:     Mental Status: He is alert and oriented to person, place, and time.     Coordination: Coordination normal.  Psychiatric:        Attention and Perception: Attention and perception normal. He does not perceive auditory or visual hallucinations.        Mood and Affect: Mood is anxious. Mood is not depressed. Affect is not labile, blunt, angry or inappropriate.        Speech: Speech normal.        Behavior: Behavior normal.        Thought Content: Thought content normal. Thought content does not include homicidal or suicidal ideation. Thought content does not include homicidal or suicidal plan.        Cognition and Memory: Cognition and memory normal.        Judgment: Judgment normal.     Comments: Insight intact. No delusions.      Lab Review:     Component Value Date/Time   NA 140 06/06/2018 1726   K 4.0 06/06/2018 1726   CL 106 06/06/2018 1726   CO2 24 06/06/2018 1726   GLUCOSE 143 (H) 06/06/2018 1726   BUN 8 06/06/2018 1726   CREATININE 0.91 06/06/2018 1726   CALCIUM 9.3 06/06/2018 1726   PROT 7.2 06/06/2018 1726   ALBUMIN 4.7 06/06/2018 1726   AST 48 (H) 06/06/2018 1726   ALT 47 (H) 06/06/2018 1726   ALKPHOS 45 06/06/2018 1726   BILITOT 0.8  06/06/2018 1726   GFRNONAA >60 06/06/2018 1726   GFRAA >60 06/06/2018 1726       Component Value Date/Time   WBC 6.7 06/06/2018 1726   RBC 4.51 06/06/2018 1726   HGB 13.5 06/06/2018 1726   HCT 40.2 06/06/2018 1726   PLT 319 06/06/2018 1726   MCV 89.1 06/06/2018 1726   MCH 29.9 06/06/2018 1726   MCHC 33.6 06/06/2018 1726   RDW 12.9 06/06/2018 1726   LYMPHSABS 2.4 06/06/2018 1726   MONOABS 0.5 06/06/2018 1726   EOSABS 0.2 06/06/2018 1726   BASOSABS 0.1 06/06/2018 1726    No results found for: POCLITH, LITHIUM   No results found for: PHENYTOIN, PHENOBARB, VALPROATE, CBMZ   .res Assessment: Plan:   Pt seen for 30 minutes and greater than 50% of session spent counseling pt re: anxiety tx to include using lowest possible effective dose of Xanax to minimize risk of dependence and tolerance. Discussed potential benefits, risks, and side effects of Propranolol prn anxiety and pt agrees to trial of Propranolol.  Will start Propranolol 10 mg 1-2 tabs po BID prn anxiety.  Will continue all other medications as prescribed. Pt to f/u in 3 months or sooner if clinically indicated.  Patient advised to contact office with any questions, adverse effects, or acute worsening in signs and symptoms.  [name] was seen today for add and anxiety.  Diagnoses and all orders for this visit:  Social anxiety disorder -     Discontinue: propranolol (INDERAL) 10 MG tablet; Take 1-2 tabs po BID prn anxiety -     ALPRAZolam (XANAX) 0.5 MG tablet; TAKE ONE-HALF-1 TABLET EVERY DAY AS NEEDED FOR PANIC ATTACKS  Anxiety state -     ALPRAZolam (XANAX) 0.5 MG tablet; TAKE ONE-HALF-1 TABLET EVERY DAY AS NEEDED FOR PANIC ATTACKS  Attention deficit hyperactivity disorder (ADHD), unspecified ADHD  type -     amphetamine-dextroamphetamine (ADDERALL XR) 25 MG 24 hr capsule; Take 1 capsule by mouth every morning. -     amphetamine-dextroamphetamine (ADDERALL XR) 25 MG 24 hr capsule; Take 1 capsule by mouth every  morning. -     amphetamine-dextroamphetamine (ADDERALL XR) 25 MG 24 hr capsule; Take 1 capsule by mouth every morning. -     amphetamine-dextroamphetamine (ADDERALL) 10 MG tablet; Take 1 tablet (10 mg total) by mouth daily as needed. -     amphetamine-dextroamphetamine (ADDERALL) 10 MG tablet; Take 1 tablet (10 mg total) by mouth daily as needed.    Please see After Visit Summary for patient specific instructions.  No future appointments.  No orders of the defined types were placed in this encounter.     -------------------------------

## 2019-02-27 ENCOUNTER — Other Ambulatory Visit: Payer: Self-pay | Admitting: Psychiatry

## 2019-02-27 DIAGNOSIS — F4010 Social phobia, unspecified: Secondary | ICD-10-CM

## 2019-03-04 ENCOUNTER — Other Ambulatory Visit: Payer: Self-pay | Admitting: Gastroenterology

## 2019-03-25 ENCOUNTER — Other Ambulatory Visit: Payer: Self-pay | Admitting: Family Medicine

## 2019-03-25 DIAGNOSIS — R079 Chest pain, unspecified: Secondary | ICD-10-CM

## 2019-03-25 DIAGNOSIS — F17200 Nicotine dependence, unspecified, uncomplicated: Secondary | ICD-10-CM

## 2019-04-10 ENCOUNTER — Other Ambulatory Visit: Payer: Self-pay

## 2019-04-10 DIAGNOSIS — F411 Generalized anxiety disorder: Secondary | ICD-10-CM

## 2019-04-10 DIAGNOSIS — F4010 Social phobia, unspecified: Secondary | ICD-10-CM

## 2019-04-10 MED ORDER — ALPRAZOLAM 0.5 MG PO TABS: ORAL_TABLET | ORAL | 0 refills | Status: DC

## 2019-04-11 ENCOUNTER — Other Ambulatory Visit: Payer: Self-pay | Admitting: Family Medicine

## 2019-04-12 ENCOUNTER — Ambulatory Visit
Admission: RE | Admit: 2019-04-12 | Discharge: 2019-04-12 | Disposition: A | Discharge status: Home / Self Care | Payer: Commercial Managed Care - PPO | Source: Ambulatory Visit | Attending: Family Medicine | Admitting: Family Medicine

## 2019-04-12 DIAGNOSIS — R079 Chest pain, unspecified: Secondary | ICD-10-CM

## 2019-04-12 DIAGNOSIS — F17200 Nicotine dependence, unspecified, uncomplicated: Secondary | ICD-10-CM

## 2019-04-15 ENCOUNTER — Other Ambulatory Visit: Payer: Self-pay

## 2019-04-15 ENCOUNTER — Telehealth: Payer: Self-pay | Admitting: Psychiatry

## 2019-04-15 DIAGNOSIS — F909 Attention-deficit hyperactivity disorder, unspecified type: Secondary | ICD-10-CM

## 2019-04-15 MED ORDER — AMPHETAMINE-DEXTROAMPHET ER 25 MG PO CP24: 25.0000 mg | ORAL_CAPSULE | ORAL | 0 refills | Status: DC

## 2019-04-15 NOTE — Telephone Encounter (Signed)
Last refill 03/04/2019 Pended for Shanda Bumps to submit

## 2019-04-15 NOTE — Telephone Encounter (Signed)
Patient called and said he needs a refill on his adderall sent to [facility] on [address]. Next appt 1/29

## 2019-05-03 ENCOUNTER — Encounter: Payer: Self-pay | Admitting: Psychiatry

## 2019-05-03 ENCOUNTER — Other Ambulatory Visit: Payer: Self-pay

## 2019-05-03 ENCOUNTER — Ambulatory Visit (INDEPENDENT_AMBULATORY_CARE_PROVIDER_SITE_OTHER): Payer: Commercial Managed Care - PPO | Admitting: Psychiatry

## 2019-05-03 DIAGNOSIS — F4010 Social phobia, unspecified: Secondary | ICD-10-CM | POA: Diagnosis not present

## 2019-05-03 DIAGNOSIS — F909 Attention-deficit hyperactivity disorder, unspecified type: Secondary | ICD-10-CM | POA: Diagnosis not present

## 2019-05-03 DIAGNOSIS — F411 Generalized anxiety disorder: Secondary | ICD-10-CM

## 2019-05-03 MED ORDER — AMPHETAMINE-DEXTROAMPHET ER 25 MG PO CP24: 25.0000 mg | ORAL_CAPSULE | ORAL | 0 refills | Status: DC

## 2019-05-03 MED ORDER — ALPRAZOLAM 0.5 MG PO TABS: ORAL_TABLET | ORAL | 2 refills | Status: DC

## 2019-05-03 MED ORDER — AMPHETAMINE-DEXTROAMPHETAMINE 10 MG PO TABS: 10.0000 mg | ORAL_TABLET | Freq: Every day | ORAL | 0 refills | Status: DC | PRN

## 2019-05-03 NOTE — Progress Notes (Signed)
Victor [name] [id number] [birth year] 30 y.o.  Subjective:   Patient ID:  Victor [name] is a 29 y.o. (DOB 20-Nov-1990) male.  Chief Complaint:  Chief Complaint  Patient presents with  . Anxiety  . Depression  . ADD    HPI [name] presents to the office today for follow-up of ADD. He reports that he has been feeling more stressed and "down."  He reports that he has had negative thoughts. He continues to have frequent anxiety. Reports that he has anxiety while driving and will take Xanax prn when he drives. Denies any recent full blown panic attacks. Reports that he has been biting nails when driving. He reports that has noticed more anxious thoughts and worry. Occasionally feels as if his heart is racing. He reports difficulty falling and staying asleep. Reports multiple awakenings. Appetite has been "normal." Low energy and motivation. Concentration has been ok at work. Denies being able to focus outside of work. Enjoying discovering new bands.   Denies SI.   Working most of his waking hours. He reports that he worked 70 hours last week and spent about 18 hours commuting.   He reports that he does not take Adderall IR on a daily basis. He reports needing to take Xanax almost daily.   Recently ended relationship with girlfriend of 3 years.   Past Psychiatric Medication Trials: Propranolol- Somewhat effective Gabapentin-ineffective Adderall XR Adderall Vyvanse Xanax   PHQ2-9     Office Visit from 07/01/2015 in Primary Care at [facility] Total Score  0       Review of Systems:  Review of Systems  Cardiovascular: Negative for palpitations.  Musculoskeletal: Negative for gait problem.  Neurological: Negative for tremors.  Psychiatric/Behavioral:       Please refer to HPI    Medications: I have reviewed the patient's current medications.  Current Outpatient Medications  Medication Sig Dispense Refill  . [START ON 05/08/2019] ALPRAZolam (XANAX) 0.5 MG tablet TAKE  ONE-HALF-1 TABLET EVERY DAY AS NEEDED FOR PANIC ATTACKS 30 tablet 2  . [START ON 06/10/2019] amphetamine-dextroamphetamine (ADDERALL XR) 25 MG 24 hr capsule Take 1 capsule by mouth every morning. 30 capsule 0  . [START ON 05/13/2019] amphetamine-dextroamphetamine (ADDERALL XR) 25 MG 24 hr capsule Take 1 capsule by mouth every morning. 30 capsule 0  . [START ON 07/08/2019] amphetamine-dextroamphetamine (ADDERALL XR) 25 MG 24 hr capsule Take 1 capsule by mouth every morning. 30 capsule 0  . [START ON 06/28/2019] amphetamine-dextroamphetamine (ADDERALL) 10 MG tablet Take 1 tablet (10 mg total) by mouth daily as needed. 30 tablet 0  . [START ON 05/31/2019] amphetamine-dextroamphetamine (ADDERALL) 10 MG tablet Take 1 tablet (10 mg total) by mouth daily as needed. 30 tablet 0  . ibuprofen (ADVIL) 200 MG tablet Take 200 mg by mouth every 6 (six) hours as needed.    Marland Kitchen omeprazole (PRILOSEC) 40 MG capsule TAKE 1 CAPSULE(40 MG) BY MOUTH TWICE DAILY 30 MINUTES BEFORE MEALS 60 capsule 3  . propranolol (INDERAL) 10 MG tablet TAKE 1 TO 2 TABLETS BY MOUTH TWICE DAILY AS NEEDED FOR ANXIETY 360 tablet 0  . amphetamine-dextroamphetamine (ADDERALL) 10 MG tablet Take 1 tablet (10 mg total) by mouth daily as needed. 30 tablet 0  . gabapentin (NEURONTIN) 100 MG capsule TAKE 2 CAPSULES BY MOUTH AS NEEDED FOR ANXIETY LIMIT A MAXIMUM DAILY DOSE OF 6 CAPSULES (Patient not taking: Reported on 05/03/2019) 120 capsule 0   No current facility-administered medications for this visit.    Medication Side Effects:  None  Allergies: No Known Allergies  Past Medical History:  Diagnosis Date  . Acid reflux   . ADHD (attention deficit hyperactivity disorder)   . Anxiety   . Hiatal hernia   . Panic attack     Family History  Problem Relation Age of Onset  . Colon cancer Neg Hx   . Esophageal cancer Neg Hx   . Rectal cancer Neg Hx   . Stomach cancer Neg Hx     Social History   Socioeconomic History  . Marital status: Single     Spouse name: n/a  . Number of children: 0  . Years of education: Associates  . Highest education level: Not on file  Occupational History  . Occupation: Chief Executive Officer  Tobacco Use  . Smoking status: Current Every Day Smoker    Packs/day: 0.50    Types: Cigarettes    Last attempt to quit: 04/01/2015    Years since quitting: 4.0  . Smokeless tobacco: Never Used  Substance and Sexual Activity  . Alcohol use: Yes    Alcohol/week: 0.0 standard drinks    Comment: 3 beers a night.   . Drug use: Yes    Types: Cocaine    Comment: "every once in awhile"  . Sexual activity: Not on file  Other Topics Concern  . Not on file  Social History Narrative   Lives with his girlfriend currently    Right handed   Caffeine: about 1 can soda/day   Social Determinants of Health   Financial Resource Strain:   . Difficulty of Paying Living Expenses: Not on file  Food Insecurity:   . Worried About Programme researcher, broadcasting/film/video in the Last Year: Not on file  . Ran Out of Food in the Last Year: Not on file  Transportation Needs:   . Lack of Transportation (Medical): Not on file  . Lack of Transportation (Non-Medical): Not on file  Physical Activity:   . Days of Exercise per Week: Not on file  . Minutes of Exercise per Session: Not on file  Stress:   . Feeling of Stress : Not on file  Social Connections:   . Frequency of Communication with Friends and Family: Not on file  . Frequency of Social Gatherings with Friends and Family: Not on file  . Attends Religious Services: Not on file  . Active Member of Clubs or Organizations: Not on file  . Attends Banker Meetings: Not on file  . Marital Status: Not on file  Intimate Partner Violence:   . Fear of Current or Ex-Partner: Not on file  . Emotionally Abused: Not on file  . Physically Abused: Not on file  . Sexually Abused: Not on file    Past Medical History, Surgical history, Social history, and Family history were reviewed  and updated as appropriate.   Please see review of systems for further details on the patient's review from today.   Objective:   Physical Exam:  BP (!) 143/81   Pulse 97   Physical Exam Constitutional:      General: He is not in acute distress.    Appearance: He is well-developed.  Musculoskeletal:        General: No deformity.  Neurological:     Mental Status: He is alert and oriented to person, place, and time.     Coordination: Coordination normal.  Psychiatric:        Attention and Perception: Attention and perception normal. He does not perceive  auditory or visual hallucinations.        Mood and Affect: Mood is anxious and depressed. Affect is not labile, blunt, angry or inappropriate.        Speech: Speech normal.        Behavior: Behavior normal.        Thought Content: Thought content normal. Thought content is not paranoid or delusional. Thought content does not include homicidal or suicidal ideation. Thought content does not include homicidal or suicidal plan.        Cognition and Memory: Cognition and memory normal.        Judgment: Judgment normal.     Comments: Insight intact     Lab Review:     Component Value Date/Time   NA 140 06/06/2018 1726   K 4.0 06/06/2018 1726   CL 106 06/06/2018 1726   CO2 24 06/06/2018 1726   GLUCOSE 143 (H) 06/06/2018 1726   BUN 8 06/06/2018 1726   CREATININE 0.91 06/06/2018 1726   CALCIUM 9.3 06/06/2018 1726   PROT 7.2 06/06/2018 1726   ALBUMIN 4.7 06/06/2018 1726   AST 48 (H) 06/06/2018 1726   ALT 47 (H) 06/06/2018 1726   ALKPHOS 45 06/06/2018 1726   BILITOT 0.8 06/06/2018 1726   GFRNONAA >60 06/06/2018 1726   GFRAA >60 06/06/2018 1726       Component Value Date/Time   WBC 6.7 06/06/2018 1726   RBC 4.51 06/06/2018 1726   HGB 13.5 06/06/2018 1726   HCT 40.2 06/06/2018 1726   PLT 319 06/06/2018 1726   MCV 89.1 06/06/2018 1726   MCH 29.9 06/06/2018 1726   MCHC 33.6 06/06/2018 1726   RDW 12.9 06/06/2018 1726    LYMPHSABS 2.4 06/06/2018 1726   MONOABS 0.5 06/06/2018 1726   EOSABS 0.2 06/06/2018 1726   BASOSABS 0.1 06/06/2018 1726    No results found for: POCLITH, LITHIUM   No results found for: PHENYTOIN, PHENOBARB, VALPROATE, CBMZ   .res Assessment: Plan:   Patient seen for 30 minutes and discussed possible treatment options for social anxiety and depression.  Provided counseling regarding SSRIs and that prescription of medication is indicated for both depression and anxiety.  Patient reports some concern about taking this type of medication due to negative impressions from family members that have taken similar medications.  Patient reports that he would prefer to try to treat mood and anxiety with therapy 1st, and then consider medication options if signs and symptoms persist.  Referral made to [name], LCSW for therapy. Will continue Adderall XR and Adderall for attention deficit. Continue Xanax for anxiety. Patient to follow-up in 2 to 3 months with this provider or sooner if clinically indicated. Patient advised to contact office with any questions, adverse effects, or acute worsening in signs and symptoms.  Geo was seen today for anxiety, depression and add.  Diagnoses and all orders for this visit:  Anxiety state -     ALPRAZolam (XANAX) 0.5 MG tablet; TAKE ONE-HALF-1 TABLET EVERY DAY AS NEEDED FOR PANIC ATTACKS  Social anxiety disorder -     ALPRAZolam (XANAX) 0.5 MG tablet; TAKE ONE-HALF-1 TABLET EVERY DAY AS NEEDED FOR PANIC ATTACKS  Attention deficit hyperactivity disorder (ADHD), unspecified ADHD type -     amphetamine-dextroamphetamine (ADDERALL XR) 25 MG 24 hr capsule; Take 1 capsule by mouth every morning. -     amphetamine-dextroamphetamine (ADDERALL XR) 25 MG 24 hr capsule; Take 1 capsule by mouth every morning. -     amphetamine-dextroamphetamine (ADDERALL XR)  25 MG 24 hr capsule; Take 1 capsule by mouth every morning. -     amphetamine-dextroamphetamine (ADDERALL) 10  MG tablet; Take 1 tablet (10 mg total) by mouth daily as needed. -     amphetamine-dextroamphetamine (ADDERALL) 10 MG tablet; Take 1 tablet (10 mg total) by mouth daily as needed. -     amphetamine-dextroamphetamine (ADDERALL) 10 MG tablet; Take 1 tablet (10 mg total) by mouth daily as needed.     Please see After Visit Summary for patient specific instructions.  Future Appointments  Date Time Provider Department Center  05/09/2019  2:00 PM Pauline Good, [place] [facility] None  07/04/2019  4:00 PM Corie Chiquito, PMHNP [facility] None    No orders of the defined types were placed in this encounter.   -------------------------------

## 2019-05-09 ENCOUNTER — Ambulatory Visit: Payer: Commercial Managed Care - PPO | Admitting: Addiction (Substance Use Disorder)

## 2019-05-23 ENCOUNTER — Other Ambulatory Visit: Payer: Self-pay

## 2019-05-23 ENCOUNTER — Ambulatory Visit (INDEPENDENT_AMBULATORY_CARE_PROVIDER_SITE_OTHER): Payer: Commercial Managed Care - PPO | Admitting: Addiction (Substance Use Disorder)

## 2019-05-23 DIAGNOSIS — F411 Generalized anxiety disorder: Secondary | ICD-10-CM | POA: Diagnosis not present

## 2019-05-23 DIAGNOSIS — F4010 Social phobia, unspecified: Secondary | ICD-10-CM

## 2019-05-23 DIAGNOSIS — F410 Panic disorder [episodic paroxysmal anxiety] without agoraphobia: Secondary | ICD-10-CM | POA: Diagnosis not present

## 2019-05-23 DIAGNOSIS — F430 Acute stress reaction: Secondary | ICD-10-CM
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
## 2019-05-24 ENCOUNTER — Encounter: Payer: Self-pay | Admitting: Addiction (Substance Use Disorder)

## 2019-06-07 ENCOUNTER — Other Ambulatory Visit: Payer: Self-pay

## 2019-06-07 ENCOUNTER — Ambulatory Visit (INDEPENDENT_AMBULATORY_CARE_PROVIDER_SITE_OTHER): Payer: Commercial Managed Care - PPO | Admitting: Addiction (Substance Use Disorder)

## 2019-06-07 DIAGNOSIS — F4010 Social phobia, unspecified: Secondary | ICD-10-CM | POA: Diagnosis not present

## 2019-06-07 DIAGNOSIS — F411 Generalized anxiety disorder: Secondary | ICD-10-CM | POA: Diagnosis not present

## 2019-06-07 NOTE — Progress Notes (Signed)
[facility] Counselor/Therapist Progress Note  Patient ID: Victor [name], MRN: [id number],    Date: 06/07/2019  Time Spent: 9:08-10:01 53 mins  Treatment Type: Individual Therapy  Reported Symptoms: Im slowing down on my drinking, and having less negative self-talk & no panic attacks.  Mental Status Exam:  Appearance:   Neat     Behavior:  Sharing  Motor:  Normal  Speech/Language:   Clear and Coherent  Affect:  Constricted and Tearful  Mood:  anxious and sad  Thought process:  circumstantial and flight of ideas  Thought content:    Obsessions, Rumination and Tangential  Sensory/Perceptual disturbances:    WNL  Orientation:  x4  Attention:  [name]  Concentration:  [name]  Memory:  WNL  Fund of knowledge:   [name]  Insight:    Fair  Judgment:   [name]  Impulse Control:  [name]   Risk Assessment: Danger to Self:  No Self-injurious Behavior: No Danger to Others: No Duty to Warn:no Physical Aggression / Violence:No  Access to Firearms a concern: No  Gang Involvement:No   Subjective: Client came in reporting decreased drinking and losing a lot of weight from that and client reported thinking that drinking has a lot to do with everything: ie may have caused panic attacks, negative self-talk, etc. Client reported having some improvement with his MH due to changes related to drinking. Therapist inquired further with clients safety, sobriety, and stability. Client denied SI/HI/AVH and reported no cravings and reducing harm/drinking. Therapist used Relapse Prevention therapy to continue encouraging his reduction of drinking. Client then began to process some of the panic and depression that stems from childhood and his mom. Therapist normalized client's experience and provided empathy for client's relationship with his mom. Client reported: "I leave no trace of any presence of me in the house because my mom likes to blame anyone- esp me." Client processed how he felt like he is the scapegoat  and has been since his dad died when he was 2 (& he is the youngest). Therapist used MI, Grief therapy and family systems to support client in walking through discussion of these family dynamics.  Interventions: Cognitive Behavioral Therapy, Mindfulness Meditation, Motivational Interviewing, Grief Therapy and Family Systems  Diagnosis:   ICD-10-CM   1. Generalized anxiety disorder  F41.1   2. Social anxiety disorder  F40.10    Plan of Care: Client to return for weekly therapy with Victor [name], therapist, to review again in 6 months. Client to engage in positive self talk and challenging negative internal ruminationsand self talk causing client to be overly anxious and worriedusing CBT, on daily practice. Client to engage in mindfulness: ie body scans each eveneing to help process and discharge emotional distress & recognize emotions. Client to utilize BSP (brainspotting) with therapist to help client regulate their anxiety in a somatic- felt body sense way: (ieby working to reducemuscle tension,ruminations,increased heart rate, constant worrying and feeling"hyper" ) by decreasing anxietyby 33% in the next 6 months. Client to engage in SA txt and RPT relapse prevention therapy AEB coming to therapy weekly and implementing recovery oriented coping strategies to help reduce use of alcohol and to find relief from symptoms of MH disorders and trauma that cause them to want to escape from reality and numb their mind&body.  Client to prioritize sleep 8+ hours each week night AEB going to bed by 10pm each night.  Victor Good, LCSW, LCAS, CCTP, CCS-I, BSP

## 2019-06-21 ENCOUNTER — Other Ambulatory Visit: Payer: Self-pay

## 2019-06-21 ENCOUNTER — Encounter: Payer: Self-pay | Admitting: Addiction (Substance Use Disorder)

## 2019-06-21 ENCOUNTER — Ambulatory Visit (INDEPENDENT_AMBULATORY_CARE_PROVIDER_SITE_OTHER): Payer: Commercial Managed Care - PPO | Admitting: Addiction (Substance Use Disorder)

## 2019-06-21 DIAGNOSIS — F430 Acute stress reaction: Secondary | ICD-10-CM

## 2019-06-21 DIAGNOSIS — F411 Generalized anxiety disorder: Secondary | ICD-10-CM | POA: Diagnosis not present

## 2019-06-21 DIAGNOSIS — F410 Panic disorder [episodic paroxysmal anxiety] without agoraphobia: Secondary | ICD-10-CM | POA: Diagnosis not present
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
## 2019-06-28 ENCOUNTER — Ambulatory Visit: Payer: Commercial Managed Care - PPO | Admitting: Psychiatry

## 2019-07-04 ENCOUNTER — Ambulatory Visit (INDEPENDENT_AMBULATORY_CARE_PROVIDER_SITE_OTHER): Payer: Commercial Managed Care - PPO | Admitting: Addiction (Substance Use Disorder)

## 2019-07-04 ENCOUNTER — Ambulatory Visit: Payer: Commercial Managed Care - PPO | Admitting: Psychiatry

## 2019-07-04 ENCOUNTER — Encounter: Payer: Self-pay | Admitting: Addiction (Substance Use Disorder)

## 2019-07-04 ENCOUNTER — Other Ambulatory Visit: Payer: Self-pay

## 2019-07-04 DIAGNOSIS — F4010 Social phobia, unspecified: Secondary | ICD-10-CM | POA: Diagnosis not present

## 2019-07-04 DIAGNOSIS — F411 Generalized anxiety disorder: Secondary | ICD-10-CM | POA: Diagnosis not present
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
## 2019-07-12 ENCOUNTER — Encounter: Payer: Self-pay | Admitting: Psychiatry

## 2019-07-12 ENCOUNTER — Ambulatory Visit (INDEPENDENT_AMBULATORY_CARE_PROVIDER_SITE_OTHER): Payer: Commercial Managed Care - PPO | Admitting: Psychiatry

## 2019-07-12 ENCOUNTER — Other Ambulatory Visit: Payer: Self-pay

## 2019-07-12 DIAGNOSIS — F4010 Social phobia, unspecified: Secondary | ICD-10-CM

## 2019-07-12 DIAGNOSIS — F909 Attention-deficit hyperactivity disorder, unspecified type: Secondary | ICD-10-CM | POA: Diagnosis not present

## 2019-07-12 DIAGNOSIS — F411 Generalized anxiety disorder: Secondary | ICD-10-CM | POA: Diagnosis not present

## 2019-07-12 MED ORDER — AMPHETAMINE-DEXTROAMPHET ER 25 MG PO CP24: 25.0000 mg | ORAL_CAPSULE | ORAL | 0 refills | Status: DC

## 2019-07-12 MED ORDER — ALPRAZOLAM 0.5 MG PO TABS: ORAL_TABLET | ORAL | 2 refills | Status: DC

## 2019-07-12 NOTE — Progress Notes (Signed)
Victor [name] [id number] 1990/08/04 29 y.o.  Subjective:   Patient ID:  Victor [name] is a 29 y.o. (DOB 1990/09/14) male.  Chief Complaint:  Chief Complaint  Patient presents with  . Follow-up    Anxiety and Depression  . ADD    HPI [name] presents to the office today for follow-up of ADD. "A lot better" and reports that therapy has been helpful. He reports improved anxiety, particularly in the morning. Some occ anxiety and is working with therapist on how to cope with this. Denies any panic attacks in the last 2 weeks. Has had infrequent physical s/s with anxiety. He reports that he has some anxiety with driving, particularly when driving in heavier traffic and when it is dark. Continues to bite nails at times. Mood is "a little more optimistic." Reports some "highs and lows" with mood. He reports that he is trying to get more sleep. Awakens around 4:30 am and gets home around 7-7:30 pm. Has some difficulty falling asleep and will occ take Benadryl to help with sleep initiation. Appetite has been good. He reports that he has intentionally lost 10 lbs with cutting out meats and other foods. Energy and motivation remain low. Concentration has been ok and is able to focus on the task at hand. Reports that concentration is adequate at work. Denies SI.  Plans to get covid vaccine today. Now working 5 days a week instead of 6.    Past Psychiatric Medication Trials: Propranolol- Somewhat effective Gabapentin-ineffective Adderall XR Adderall Vyvanse Xanax  PHQ2-9     Office Visit from 07/01/2015 in Primary Care at [facility] Total Score  0       Review of Systems:  Review of Systems  Cardiovascular:       He reports infrequent palpitations with anxiety.   Musculoskeletal: Negative for gait problem.  Neurological: Negative for tremors.  Psychiatric/Behavioral:       Please refer to HPI    Medications: I have reviewed the patient's current medications.  Current Outpatient  Medications  Medication Sig Dispense Refill  . [START ON 08/09/2019] ALPRAZolam (XANAX) 0.5 MG tablet TAKE ONE-HALF-1 TABLET EVERY DAY AS NEEDED FOR PANIC ATTACKS 30 tablet 2  . [START ON 09/06/2019] amphetamine-dextroamphetamine (ADDERALL XR) 25 MG 24 hr capsule Take 1 capsule by mouth every morning. 30 capsule 0  . [START ON 08/09/2019] amphetamine-dextroamphetamine (ADDERALL XR) 25 MG 24 hr capsule Take 1 capsule by mouth every morning. 30 capsule 0  . ibuprofen (ADVIL) 200 MG tablet Take 200 mg by mouth every 6 (six) hours as needed.    Marland Kitchen omeprazole (PRILOSEC) 40 MG capsule TAKE 1 CAPSULE(40 MG) BY MOUTH TWICE DAILY 30 MINUTES BEFORE MEALS 60 capsule 3  . propranolol (INDERAL) 10 MG tablet TAKE 1 TO 2 TABLETS BY MOUTH TWICE DAILY AS NEEDED FOR ANXIETY 360 tablet 0  . amphetamine-dextroamphetamine (ADDERALL XR) 25 MG 24 hr capsule Take 1 capsule by mouth every morning. 30 capsule 0  . amphetamine-dextroamphetamine (ADDERALL) 10 MG tablet Take 1 tablet (10 mg total) by mouth daily as needed. 30 tablet 0  . amphetamine-dextroamphetamine (ADDERALL) 10 MG tablet Take 1 tablet (10 mg total) by mouth daily as needed. 30 tablet 0  . amphetamine-dextroamphetamine (ADDERALL) 10 MG tablet Take 1 tablet (10 mg total) by mouth daily as needed. 30 tablet 0   No current facility-administered medications for this visit.    Medication Side Effects: Other: Occ shaky feeling when he takes Adderall on an empty stomach  Allergies:  No Known Allergies  Past Medical History:  Diagnosis Date  . Acid reflux   . ADHD (attention deficit hyperactivity disorder)   . Anxiety   . Hiatal hernia   . Panic attack     Family History  Problem Relation Age of Onset  . Colon cancer Neg Hx   . Esophageal cancer Neg Hx   . Rectal cancer Neg Hx   . Stomach cancer Neg Hx     Social History   Socioeconomic History  . Marital status: Single    Spouse name: n/a  . Number of children: 0  . Years of education: Associates   . Highest education level: Not on file  Occupational History  . Occupation: Chief Executive Officer  Tobacco Use  . Smoking status: Current Every Day Smoker    Packs/day: 0.50    Types: Cigarettes    Last attempt to quit: 04/01/2015    Years since quitting: 4.2  . Smokeless tobacco: Never Used  Substance and Sexual Activity  . Alcohol use: Yes    Alcohol/week: 0.0 standard drinks    Comment: Occ  . Drug use: Not Currently    Types: Cocaine  . Sexual activity: Not on file  Other Topics Concern  . Not on file  Social History Narrative   Lives with his girlfriend currently    Right handed   Caffeine: about 1 can soda/day   Social Determinants of Health   Financial Resource Strain:   . Difficulty of Paying Living Expenses:   Food Insecurity:   . Worried About Programme researcher, broadcasting/film/video in the Last Year:   . Barista in the Last Year:   Transportation Needs:   . Freight forwarder (Medical):   Marland Kitchen Lack of Transportation (Non-Medical):   Physical Activity:   . Days of Exercise per Week:   . Minutes of Exercise per Session:   Stress:   . Feeling of Stress :   Social Connections:   . Frequency of Communication with Friends and Family:   . Frequency of Social Gatherings with Friends and Family:   . Attends Religious Services:   . Active Member of Clubs or Organizations:   . Attends Banker Meetings:   Marland Kitchen Marital Status:   Intimate Partner Violence:   . Fear of Current or Ex-Partner:   . Emotionally Abused:   Marland Kitchen Physically Abused:   . Sexually Abused:     Past Medical History, Surgical history, Social history, and Family history were reviewed and updated as appropriate.   Please see review of systems for further details on the patient's review from today.   Objective:   Physical Exam:  BP (!) 132/94   Pulse 71   Wt 163 lb (73.9 kg)   BMI 24.78 kg/m   Physical Exam Constitutional:      General: He is not in acute  distress. Musculoskeletal:        General: No deformity.  Neurological:     Mental Status: He is alert and oriented to person, place, and time.     Coordination: Coordination normal.  Psychiatric:        Attention and Perception: Attention and perception normal. He does not perceive auditory or visual hallucinations.        Mood and Affect: Mood is anxious. Affect is not labile, blunt, angry or inappropriate.        Speech: Speech normal.        Behavior: Behavior normal.  Thought Content: Thought content normal. Thought content is not paranoid or delusional. Thought content does not include homicidal or suicidal ideation. Thought content does not include homicidal or suicidal plan.        Cognition and Memory: Cognition and memory normal.        Judgment: Judgment normal.     Comments: Insight intact Mood presents as less anxious and less depressed compared to last exam     Lab Review:     Component Value Date/Time   NA 140 06/06/2018 1726   K 4.0 06/06/2018 1726   CL 106 06/06/2018 1726   CO2 24 06/06/2018 1726   GLUCOSE 143 (H) 06/06/2018 1726   BUN 8 06/06/2018 1726   CREATININE 0.91 06/06/2018 1726   CALCIUM 9.3 06/06/2018 1726   PROT 7.2 06/06/2018 1726   ALBUMIN 4.7 06/06/2018 1726   AST 48 (H) 06/06/2018 1726   ALT 47 (H) 06/06/2018 1726   ALKPHOS 45 06/06/2018 1726   BILITOT 0.8 06/06/2018 1726   GFRNONAA >60 06/06/2018 1726   GFRAA >60 06/06/2018 1726       Component Value Date/Time   WBC 6.7 06/06/2018 1726   RBC 4.51 06/06/2018 1726   HGB 13.5 06/06/2018 1726   HCT 40.2 06/06/2018 1726   PLT 319 06/06/2018 1726   MCV 89.1 06/06/2018 1726   MCH 29.9 06/06/2018 1726   MCHC 33.6 06/06/2018 1726   RDW 12.9 06/06/2018 1726   LYMPHSABS 2.4 06/06/2018 1726   MONOABS 0.5 06/06/2018 1726   EOSABS 0.2 06/06/2018 1726   BASOSABS 0.1 06/06/2018 1726    No results found for: POCLITH, LITHIUM   No results found for: PHENYTOIN, PHENOBARB, VALPROATE, CBMZ    .res Assessment: Plan:   Discussed option of starting additional treatment for anxiety and/or depression, however patient reports that he would prefer to continue with psychotherapy and not start additional medication at this time.  He reports some concerns about possible discontinuation signs and symptoms with some medications and becoming dependent on a medication to regulate his mood.  Also discussed potential benefits of clonidine to help with insomnia, anxiety, and mildly elevated blood pressure.  Patient reports that he would prefer not to start clonidine at this time. Will continue Adderall XR 25 mg in the morning for attention deficit. Continue Adderall 10 mg as needed for attention deficit.  He has prescription on file currently and prescription was not sent today.  Patient advised to contact office if he needs a new prescription before next visit. Recommend continuing psychotherapy with Sammuel Cooper, LCSW. Patient to follow-up in 3 months or sooner if clinically indicated. Patient advised to contact office with any questions, adverse effects, or acute worsening in signs and symptoms.  Zachery was seen today for follow-up and add.  Diagnoses and all orders for this visit:  Attention deficit hyperactivity disorder (ADHD), unspecified ADHD type -     amphetamine-dextroamphetamine (ADDERALL XR) 25 MG 24 hr capsule; Take 1 capsule by mouth every morning. -     amphetamine-dextroamphetamine (ADDERALL XR) 25 MG 24 hr capsule; Take 1 capsule by mouth every morning. -     amphetamine-dextroamphetamine (ADDERALL XR) 25 MG 24 hr capsule; Take 1 capsule by mouth every morning.  Anxiety state -     ALPRAZolam (XANAX) 0.5 MG tablet; TAKE ONE-HALF-1 TABLET EVERY DAY AS NEEDED FOR PANIC ATTACKS  Social anxiety disorder -     ALPRAZolam (XANAX) 0.5 MG tablet; TAKE ONE-HALF-1 TABLET EVERY DAY AS NEEDED FOR PANIC ATTACKS  Please see After Visit Summary for patient specific  instructions.  Future Appointments  Date Time Provider Department Center  07/19/2019  8:00 AM Pauline Good, LCSW [facility] None  08/02/2019  8:00 AM Pauline Good, LCSW [facility] None  08/23/2019  9:00 AM Pauline Good, LCSW [facility] None  09/05/2019  2:00 PM Pauline Good, LCSW [facility] None  09/19/2019  2:00 PM Pauline Good, LCSW [facility] None  10/11/2019  8:30 AM Corie Chiquito, PMHNP [facility] None    No orders of the defined types were placed in this encounter.   -------------------------------

## 2019-07-19 ENCOUNTER — Ambulatory Visit: Payer: Commercial Managed Care - PPO | Admitting: Addiction (Substance Use Disorder)

## 2019-08-02 ENCOUNTER — Other Ambulatory Visit: Payer: Self-pay

## 2019-08-02 ENCOUNTER — Encounter: Payer: Self-pay | Admitting: Addiction (Substance Use Disorder)

## 2019-08-02 ENCOUNTER — Ambulatory Visit (INDEPENDENT_AMBULATORY_CARE_PROVIDER_SITE_OTHER): Payer: Commercial Managed Care - PPO | Admitting: Addiction (Substance Use Disorder)

## 2019-08-02 DIAGNOSIS — F909 Attention-deficit hyperactivity disorder, unspecified type: Secondary | ICD-10-CM

## 2019-08-02 DIAGNOSIS — F411 Generalized anxiety disorder: Secondary | ICD-10-CM
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
## 2019-08-06 IMAGING — CT CT ANGIO NECK
2 of 7 series · 8 of 33 positions shown · IV contrast (OMNI 350)
Comparison: None.

CLINICAL DATA: Lump in neck.  Neck mass, pulsatile.

EXAM:
CT ANGIOGRAPHY NECK
TECHNIQUE: Multidetector CT imaging of the neck was performed using the
standard protocol during bolus administration of intravenous
contrast. Multiplanar CT image reconstructions and MIPs were
obtained to evaluate the vascular anatomy. Carotid stenosis
measurements (when applicable) are obtained utilizing NASCET
criteria, using the distal internal carotid diameter as the
denominator.
CONTRAST:  50mL EBYS9E-YZD IOPAMIDOL (EBYS9E-YZD) INJECTION 76%

[Series 5: cta neck · axial · 0.45mm/px · z∈[-176,-100]mm · 2 of 115 slices shown]
[im 39/115  soft-tissue]
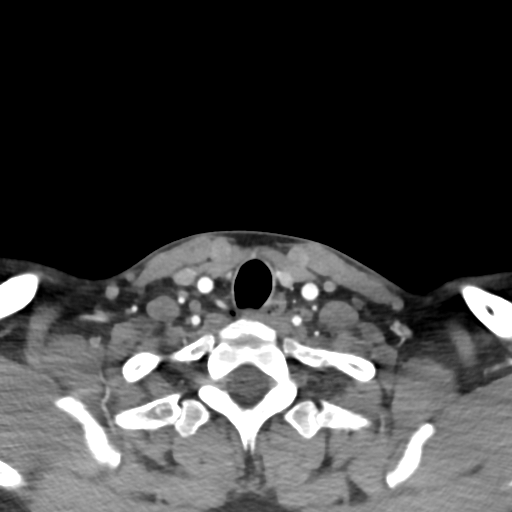
[im 77/115  soft-tissue]
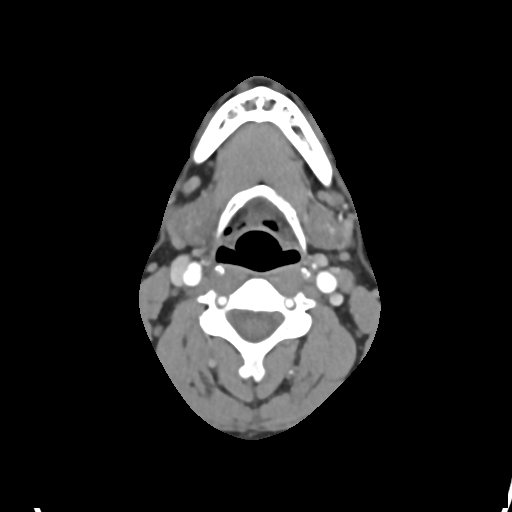

[Series 7: cta neck axial · axial · 0.39mm/px · z∈[-219,-56]mm · 6 of 229 slices shown]
[im 33/229  soft-tissue]
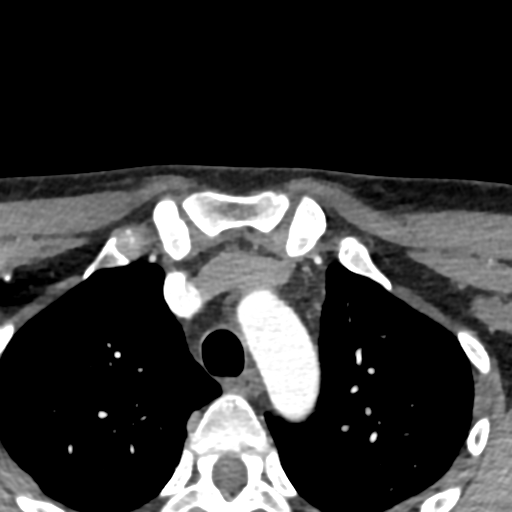
[im 66/229  bone]
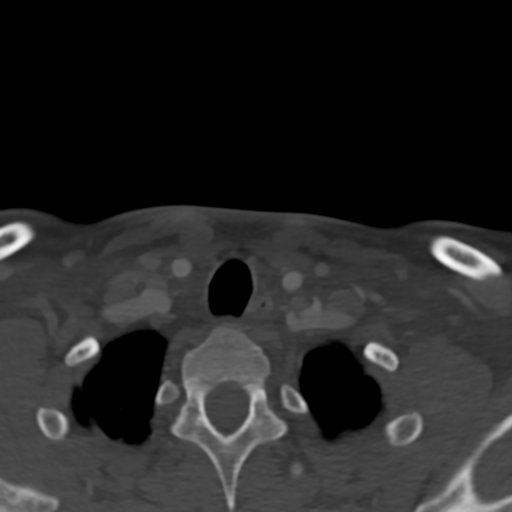
[im 98/229  soft-tissue]
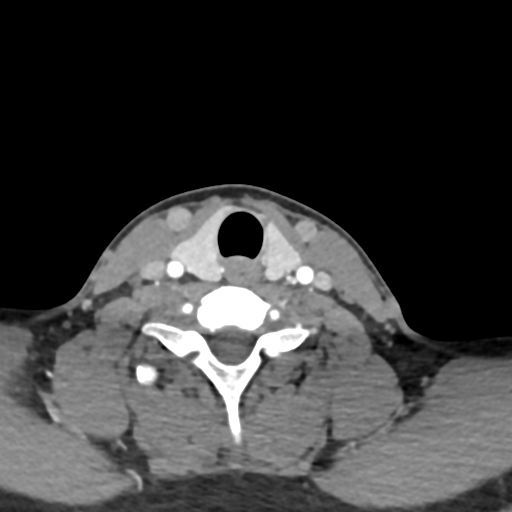
[im 131/229  bone]
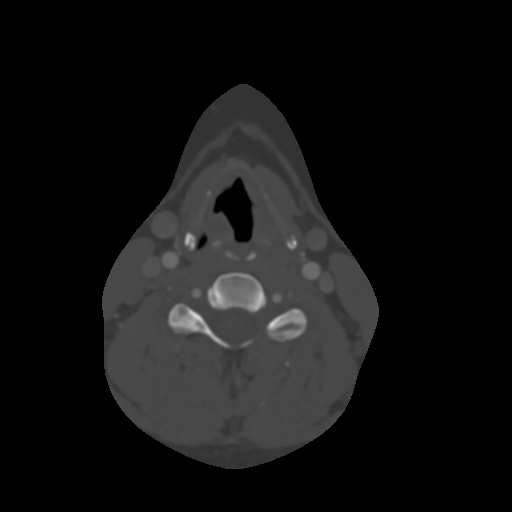
[im 163/229  soft-tissue]
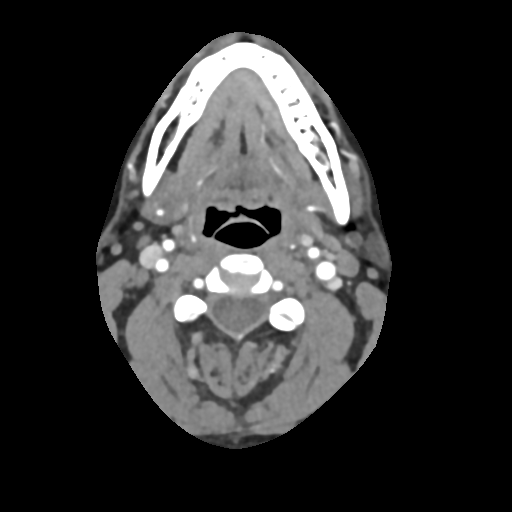
[im 196/229  bone]
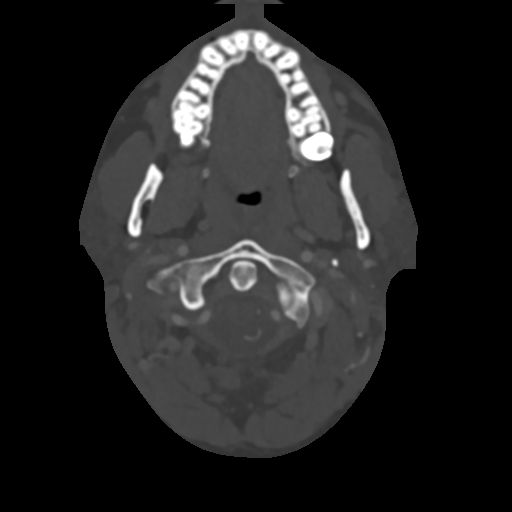

[8 of 33 positions shown; findings below may reference images not displayed]

FINDINGS: Aortic arch: A 3 vessel arch configuration is present. No
significant atherosclerotic changes are present. There is no
stenosis or aneurysm.

Right carotid system: The right common carotid artery is within
normal limits. Bifurcation is unremarkable. Cervical right ICA is
normal.

Left carotid system: The left common carotid artery is within normal
limits. Bifurcation is unremarkable. Cervical left ICA is normal.

Vertebral arteries: The vertebral arteries are codominant. Both
vertebral arteries originate from the subclavian arteries. There is
no significant stenosis of either vertebral artery in the neck. PICA
origins are visualized and normal. Vertebrobasilar junction is
normal.

Skeleton: Vertebral body heights alignment are maintained. No focal
lytic or blastic lesions are present.

Other neck: The left facial artery crosses the submandibular gland
near the area marked in the left neck. No other mass lesion is
present. Asymmetric left jugulodigastric lymph node is noted.

Upper chest: Lung apices are clear.  Thoracic inlet is normal.
IMPRESSION: 1. Normal vasculature of the neck. No focal stenosis, aneurysm, or
vascular malformation.
2. Mild prominence of jugulodigastric lymph nodes, left greater than
right. These are within normal limits.

## 2019-08-23 ENCOUNTER — Other Ambulatory Visit: Payer: Self-pay

## 2019-08-23 ENCOUNTER — Encounter: Payer: Self-pay | Admitting: Addiction (Substance Use Disorder)

## 2019-08-23 ENCOUNTER — Ambulatory Visit (INDEPENDENT_AMBULATORY_CARE_PROVIDER_SITE_OTHER): Payer: Commercial Managed Care - PPO | Admitting: Addiction (Substance Use Disorder)

## 2019-08-23 DIAGNOSIS — F330 Major depressive disorder, recurrent, mild: Secondary | ICD-10-CM | POA: Diagnosis not present

## 2019-08-23 DIAGNOSIS — F411 Generalized anxiety disorder: Secondary | ICD-10-CM
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
## 2019-09-05 ENCOUNTER — Ambulatory Visit (HOSPITAL_COMMUNITY)
Admission: EM | Admit: 2019-09-05 | Discharge: 2019-09-05 | Disposition: A | Discharge status: Home / Self Care | Payer: Commercial Managed Care - PPO | Attending: Family Medicine | Admitting: Family Medicine

## 2019-09-05 ENCOUNTER — Other Ambulatory Visit: Payer: Self-pay | Admitting: Gastroenterology

## 2019-09-05 ENCOUNTER — Other Ambulatory Visit: Payer: Self-pay

## 2019-09-05 ENCOUNTER — Ambulatory Visit (INDEPENDENT_AMBULATORY_CARE_PROVIDER_SITE_OTHER): Payer: Commercial Managed Care - PPO

## 2019-09-05 ENCOUNTER — Ambulatory Visit (INDEPENDENT_AMBULATORY_CARE_PROVIDER_SITE_OTHER): Payer: Commercial Managed Care - PPO | Admitting: Addiction (Substance Use Disorder)

## 2019-09-05 ENCOUNTER — Encounter (HOSPITAL_COMMUNITY): Payer: Self-pay

## 2019-09-05 ENCOUNTER — Encounter: Payer: Self-pay | Admitting: Addiction (Substance Use Disorder)

## 2019-09-05 DIAGNOSIS — S60221A Contusion of right hand, initial encounter: Secondary | ICD-10-CM | POA: Diagnosis not present

## 2019-09-05 DIAGNOSIS — M79641 Pain in right hand: Secondary | ICD-10-CM | POA: Diagnosis not present

## 2019-09-05 DIAGNOSIS — F4010 Social phobia, unspecified: Secondary | ICD-10-CM

## 2019-09-05 DIAGNOSIS — F330 Major depressive disorder, recurrent, mild: Secondary | ICD-10-CM

## 2019-09-05 MED ORDER — PROPRANOLOL HCL 10 MG PO TABS: ORAL_TABLET | ORAL | 0 refills | Status: DC

## 2019-09-05 MED ORDER — METHOCARBAMOL 500 MG PO TABS: 500.0000 mg | ORAL_TABLET | Freq: Two times a day (BID) | ORAL | 0 refills | Status: DC

## 2019-09-05 MED ORDER — IBUPROFEN 800 MG PO TABS: 800.0000 mg | ORAL_TABLET | Freq: Three times a day (TID) | ORAL | 0 refills | Status: DC | PRN
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
## 2019-09-05 NOTE — ED Provider Notes (Signed)
[facility]ARE [facility]ENTER   [id number] 09/05/19 Arrival Time: 1532  [id number] PAIN  SUBJECTIVE: History from: patient. Victor [name] is a 29 y.o. male complains of right hand pain that began a few hours ago. Reports that he punched a wall. Localizes the pain to the lateral dorsum of the R hand, reports bruising and swelling as well. Describes the pain as intermittent and achy in character.  Has tried OTC medications without relief. Symptoms are made worse with activity. Denies similar symptoms in the past. Denies fever, chills, erythema, weakness, numbness and tingling, saddle paresthesias, loss of bowel or bladder function.      ROS: As per HPI.  All other pertinent ROS negative.     Past Medical History:  Diagnosis Date  . Acid reflux   . ADHD (attention deficit hyperactivity disorder)   . Anxiety   . Hiatal hernia   . Panic attack    Past Surgical History:  Procedure Laterality Date  . LAPAROSCOPIC APPENDECTOMY N/A 08/03/2015   Procedure: APPENDECTOMY LAPAROSCOPIC;  Surgeon: Manus Rudd, MD;  Location: [facility] OR;  Service: General;  Laterality: N/A;   No Known Allergies No current facility-administered medications on file prior to encounter.   Current Outpatient Medications on File Prior to Encounter  Medication Sig Dispense Refill  . ALPRAZolam (XANAX) 0.5 MG tablet TAKE ONE-HALF-1 TABLET EVERY DAY AS NEEDED FOR PANIC ATTACKS 30 tablet 2  . [START ON 09/06/2019] amphetamine-dextroamphetamine (ADDERALL XR) 25 MG 24 hr capsule Take 1 capsule by mouth every morning. 30 capsule 0  . amphetamine-dextroamphetamine (ADDERALL) 10 MG tablet Take 1 tablet (10 mg total) by mouth daily as needed. 30 tablet 0  . omeprazole (PRILOSEC) 40 MG capsule TAKE 1 CAPSULE(40 MG) BY MOUTH TWICE DAILY 30 MINUTES BEFORE MEALS 60 capsule 3   Social History   Socioeconomic History  . Marital status: Single    Spouse name: n/a  . Number of children: 0  . Years of education: Associates  . Highest education  level: Not on file  Occupational History  . Occupation: Chief Executive Officer  Tobacco Use  . Smoking status: Current Every Day Smoker    Packs/day: 0.50    Types: Cigarettes    Last attempt to quit: 04/01/2015    Years since quitting: 4.4  . Smokeless tobacco: Never Used  Substance and Sexual Activity  . Alcohol use: Yes    Alcohol/week: 0.0 standard drinks    Comment: Occ  . Drug use: Not Currently    Types: Cocaine  . Sexual activity: Not on file  Other Topics Concern  . Not on file  Social History Narrative   Lives with his girlfriend currently    Right handed   Caffeine: about 1 can soda/day   Social Determinants of Health   Financial Resource Strain:   . Difficulty of Paying Living Expenses:   Food Insecurity:   . Worried About Programme researcher, broadcasting/film/video in the Last Year:   . Barista in the Last Year:   Transportation Needs:   . Freight forwarder (Medical):   Marland Kitchen Lack of Transportation (Non-Medical):   Physical Activity:   . Days of Exercise per Week:   . Minutes of Exercise per Session:   Stress:   . Feeling of Stress :   Social Connections:   . Frequency of Communication with Friends and Family:   . Frequency of Social Gatherings with Friends and Family:   . Attends Religious Services:   . Active Member  of Clubs or Organizations:   . Attends Archivist Meetings:   Marland Kitchen Marital Status:   Intimate Partner Violence:   . Fear of Current or Ex-Partner:   . Emotionally Abused:   Marland Kitchen Physically Abused:   . Sexually Abused:    Family History  Problem Relation Age of Onset  . Colon cancer Neg Hx   . Esophageal cancer Neg Hx   . Rectal cancer Neg Hx   . Stomach cancer Neg Hx     OBJECTIVE:  Vitals:   09/05/19 1544  BP: (!) 163/98  Pulse: 79  Resp: 16  Temp: 97.8 F (36.6 C)  SpO2: 97%    General appearance: ALERT; in no acute distress.  Head: NCAT Lungs: Normal respiratory effort CV: radial pulses 2+ bilaterally. Cap refill < 2  seconds Musculoskeletal:  Inspection: Skin warm, dry, clear and intact without obvious erythema. Ecchymosis and swelling to 3rd and 4th PIP joints  Palpation: Nontender to palpation ROM: FROM active and passive Skin: warm and dry Neurologic: Ambulates without difficulty; Sensation intact about the upper/ lower extremities Psychological: alert and cooperative; normal mood and affect  DIAGNOSTIC STUDIES:  DG Hand Complete Right  Result Date: 09/05/2019 CLINICAL DATA:  Pain and swelling after punching wall EXAM: RIGHT HAND - COMPLETE 3+ VIEW COMPARISON:  July 09, 2015 FINDINGS: Frontal, oblique, and lateral views were obtained. There is soft tissue swelling dorsally. No appreciable fracture or dislocation. Joint spaces appear normal. No erosive change. IMPRESSION: Soft tissue swelling. No fracture or dislocation. No evident arthropathy. Electronically Signed   By: Lowella Grip III M.D.   On: 09/05/2019 16:26     ASSESSMENT & PLAN:  1. Right hand pain   2. Contusion of right hand, initial encounter     Meds ordered this encounter  Medications  . ibuprofen (ADVIL) 800 MG tablet    Sig: Take 1 tablet (800 mg total) by mouth every 8 (eight) hours as needed for moderate pain.    Dispense:  21 tablet    Refill:  0    Order Specific Question:   Supervising Provider    Answer:   Chase Picket [id number]  . methocarbamol (ROBAXIN) 500 MG tablet    Sig: Take 1 tablet (500 mg total) by mouth 2 (two) times daily.    Dispense:  20 tablet    Refill:  0    Order Specific Question:   Supervising Provider    Answer:   Chase Picket [[id number]]    Right hand pain Right hand contusion Prescribed ibuprofen  Prescribed robaxin Continue conservative management of rest, ice, and gentle stretches Take naproxen as needed for pain relief (may cause abdominal discomfort, ulcers, and GI bleeds avoid taking with other NSAIDs) Take robaxin at nighttime for symptomatic relief. Avoid driving or  operating heavy machinery while using medication. Follow up with Dr Caralyn Guile at [facility] Follow up with PCP if symptoms persist Return or go to the ER if you have any new or worsening symptoms (fever, chills, chest pain, abdominal pain, changes in bowel or bladder habits, pain radiating into lower legs)   Reviewed expectations re: course of current medical issues. Questions answered. Outlined signs and symptoms indicating need for more acute intervention. Patient verbalized understanding. After Visit Summary given.       Faustino Congress, NP 09/05/19 1654

## 2019-09-05 NOTE — Discharge Instructions (Addendum)
I have sent in ibuprofen for pain and swelling to your pharmacy  I have also sent in Flexeril for you to take for muscle spasms as needed  Follow up with Dr Melvyn Novas at [facility]

## 2019-09-05 NOTE — ED Triage Notes (Signed)
R hand pain/swelling/bruising after punching wall last night

## 2019-09-06 ENCOUNTER — Telehealth: Payer: Self-pay | Admitting: Psychiatry

## 2019-09-06 DIAGNOSIS — F411 Generalized anxiety disorder: Secondary | ICD-10-CM

## 2019-09-06 DIAGNOSIS — F4010 Social phobia, unspecified: Secondary | ICD-10-CM

## 2019-09-06 DIAGNOSIS — F330 Major depressive disorder, recurrent, mild: Secondary | ICD-10-CM

## 2019-09-06 MED ORDER — SERTRALINE HCL 50 MG PO TABS: ORAL_TABLET | ORAL | 1 refills | Status: DC

## 2019-09-06 NOTE — Telephone Encounter (Signed)
Returned call to pt.

## 2019-09-06 NOTE — Telephone Encounter (Signed)
Received message from patient's therapist, [name], LCSW, that patient indicated during their visit that he would now like to start medication for depression and anxiety.  Attempted to call patient to discuss treatment options.  Left message that patient could call office to discuss further, particularly since medications were discussed at length during last visit, or he could schedule a sooner follow-up if he would prefer to discuss further in person.  Left callback number.

## 2019-09-19 ENCOUNTER — Ambulatory Visit: Payer: Commercial Managed Care - PPO | Admitting: Addiction (Substance Use Disorder)

## 2019-10-04 ENCOUNTER — Ambulatory Visit (HOSPITAL_COMMUNITY)
Admission: RE | Admit: 2019-10-04 | Discharge: 2019-10-04 | Disposition: A | Discharge status: Home / Self Care | Payer: Commercial Managed Care - PPO | Source: Home / Self Care | Attending: Psychiatry | Admitting: Psychiatry

## 2019-10-04 ENCOUNTER — Other Ambulatory Visit: Payer: Self-pay

## 2019-10-04 ENCOUNTER — Encounter (HOSPITAL_COMMUNITY): Payer: Self-pay

## 2019-10-04 ENCOUNTER — Ambulatory Visit: Payer: Commercial Managed Care - PPO | Admitting: Addiction (Substance Use Disorder)

## 2019-10-04 ENCOUNTER — Emergency Department (HOSPITAL_COMMUNITY)
Admission: EM | Admit: 2019-10-04 | Discharge: 2019-10-05 | Disposition: A | Discharge status: Home / Self Care | Payer: Commercial Managed Care - PPO | Source: Home / Self Care | Attending: Emergency Medicine | Admitting: Emergency Medicine

## 2019-10-04 DIAGNOSIS — F1010 Alcohol abuse, uncomplicated: Secondary | ICD-10-CM

## 2019-10-04 DIAGNOSIS — F322 Major depressive disorder, single episode, severe without psychotic features: Secondary | ICD-10-CM | POA: Diagnosis present

## 2019-10-04 DIAGNOSIS — F10129 Alcohol abuse with intoxication, unspecified: Secondary | ICD-10-CM | POA: Insufficient documentation

## 2019-10-04 DIAGNOSIS — R45851 Suicidal ideations: Secondary | ICD-10-CM | POA: Insufficient documentation

## 2019-10-04 DIAGNOSIS — Z87891 Personal history of nicotine dependence: Secondary | ICD-10-CM | POA: Insufficient documentation

## 2019-10-04 DIAGNOSIS — F10920 Alcohol use, unspecified with intoxication, uncomplicated: Secondary | ICD-10-CM

## 2019-10-04 DIAGNOSIS — Z20822 Contact with and (suspected) exposure to covid-19: Secondary | ICD-10-CM | POA: Insufficient documentation

## 2019-10-04 DIAGNOSIS — F1020 Alcohol dependence, uncomplicated: Secondary | ICD-10-CM | POA: Diagnosis present

## 2019-10-04 LAB — RAPID URINE DRUG SCREEN, HOSP PERFORMED
Amphetamines: NOT DETECTED
Barbiturates: NOT DETECTED
Benzodiazepines: NOT DETECTED
Cocaine: NOT DETECTED
Opiates: NOT DETECTED
Tetrahydrocannabinol: NOT DETECTED

## 2019-10-04 LAB — CBC
HCT: 46 % (ref 39.0–52.0)
Hemoglobin: 16.1 g/dL (ref 13.0–17.0)
MCH: 30.7 pg (ref 26.0–34.0)
MCHC: 35 g/dL (ref 30.0–36.0)
MCV: 87.6 fL (ref 80.0–100.0)
Platelets: 241 10*3/uL (ref 150–400)
RBC: 5.25 MIL/uL (ref 4.22–5.81)
RDW: 12.7 % (ref 11.5–15.5)
WBC: 6.5 10*3/uL (ref 4.0–10.5)
nRBC: 0 % (ref 0.0–0.2)

## 2019-10-04 LAB — COMPREHENSIVE METABOLIC PANEL
ALT: 55 U/L — ABNORMAL HIGH (ref 0–44)
AST: 53 U/L — ABNORMAL HIGH (ref 15–41)
Albumin: 5.1 g/dL — ABNORMAL HIGH (ref 3.5–5.0)
Alkaline Phosphatase: 59 U/L (ref 38–126)
Anion gap: 16 — ABNORMAL HIGH (ref 5–15)
BUN: 11 mg/dL (ref 6–20)
CO2: 22 mmol/L (ref 22–32)
Calcium: 9 mg/dL (ref 8.9–10.3)
Chloride: 101 mmol/L (ref 98–111)
Creatinine, Ser: 0.93 mg/dL (ref 0.61–1.24)
GFR calc Af Amer: >60 mL/min (ref >60)
GFR calc non Af Amer: >60 mL/min (ref >60)
Glucose, Bld: 99 mg/dL (ref 70–99)
Potassium: 4.5 mmol/L (ref 3.5–5.1)
Sodium: 139 mmol/L (ref 135–145)
Total Bilirubin: 0.6 mg/dL (ref 0.3–1.2)
Total Protein: 8.5 g/dL — ABNORMAL HIGH (ref 6.5–8.1)

## 2019-10-04 LAB — ETHANOL: Alcohol, Ethyl (B): 332 mg/dL (ref <10)

## 2019-10-04 MED ORDER — LORAZEPAM 2 MG/ML IJ SOLN: 0.0000 mg | Freq: Four times a day (QID) | INTRAMUSCULAR | Status: DC

## 2019-10-04 MED ORDER — LORAZEPAM 1 MG PO TABS
0.0000 mg | ORAL_TABLET | Freq: Four times a day (QID) | ORAL | Status: DC
  Administered 2019-10-05: 2 mg via ORAL
  Filled 2019-10-04: qty 2

## 2019-10-04 MED ORDER — ALUM & MAG HYDROXIDE-SIMETH 200-200-20 MG/5ML PO SUSP: 30.0000 mL | Freq: Four times a day (QID) | ORAL | Status: DC | PRN

## 2019-10-04 MED ORDER — ZOLPIDEM TARTRATE 5 MG PO TABS: 5.0000 mg | ORAL_TABLET | Freq: Every evening | ORAL | Status: DC | PRN

## 2019-10-04 MED ORDER — THIAMINE HCL 100 MG PO TABS
100.0000 mg | ORAL_TABLET | Freq: Every day | ORAL | Status: DC
  Administered 2019-10-04 – 2019-10-05 (×2): 100 mg via ORAL
  Filled 2019-10-04 (×2): qty 1

## 2019-10-04 MED ORDER — LORAZEPAM 1 MG PO TABS: 0.0000 mg | ORAL_TABLET | Freq: Two times a day (BID) | ORAL | Status: DC

## 2019-10-04 MED ORDER — THIAMINE HCL 100 MG/ML IJ SOLN: 100.0000 mg | Freq: Every day | INTRAMUSCULAR | Status: DC

## 2019-10-04 MED ORDER — ACETAMINOPHEN 325 MG PO TABS
650.0000 mg | ORAL_TABLET | ORAL | Status: DC | PRN
  Administered 2019-10-04: 650 mg via ORAL
  Filled 2019-10-04: qty 2

## 2019-10-04 MED ORDER — LORAZEPAM 2 MG/ML IJ SOLN: 0.0000 mg | Freq: Two times a day (BID) | INTRAMUSCULAR | Status: DC

## 2019-10-04 MED ORDER — ONDANSETRON HCL 4 MG PO TABS: 4.0000 mg | ORAL_TABLET | Freq: Three times a day (TID) | ORAL | Status: DC | PRN

## 2019-10-04 MED ORDER — NICOTINE 21 MG/24HR TD PT24
21.0000 mg | MEDICATED_PATCH | Freq: Every day | TRANSDERMAL | Status: DC
  Administered 2019-10-04: 21 mg via TRANSDERMAL
  Filled 2019-10-04 (×2): qty 1

## 2019-10-04 NOTE — ED Notes (Signed)
ED Provider at bedside. 

## 2019-10-04 NOTE — ED Triage Notes (Signed)
Pt reports that he needs help with his alcoholism. Reports that he usually drinks 12-18 beers per day and "tops it off with wine." Reports that he has used cocaine in the past, but not regularly. Reports that he has had about 6 today. He states that he has to drink first thing in the morning before work to stop shaking. States that he would like someone to "put a bullet in his head," but that he would never do it. Reports slight headache. Pt calm and cooperative.

## 2019-10-04 NOTE — H&P (Signed)
BH Observation Unit Provider Admission PAA/H&P  Patient Identification: Victor [name] MRN:  [id number] Date of Evaluation:  10/04/2019 Chief Complaint:  ETOH Principal Diagnosis: <principal problem not specified> Diagnosis:  Active Problems:   * No active hospital problems. *  History of Present Illness: Patient is a 29 year old male who walked into Behavioral unit asking for help. He looks intoxicated and smells of alcohol. He stated he has been drinking since he was 29 yo. Last night he bought 18 pack for 4th of July weekend but ended up drinking all of it. His friend brought him here and he is asking for help with detoxification. He stated depressed mood because his dog dies last week. He was pretty close to him. He states suicidal ideation and wants somebody else (his friend) to take him to the woods and shoot him. When asked about his goals in his life- he wants a career in [employer] for which he has recently joined virtual classes at his job. He works as Product manager. Also, he wants to have a good, comfortable life, get married, have family.  He stated he is done with his drinking problem now and wants [place] to take care of him. Associated Signs/Symptoms: Depression Symptoms:  depressed mood, anhedonia, feelings of worthlessness/guilt, hopelessness, suicidal thoughts with specific plan, (Hypo) Manic Symptoms:  NA Anxiety Symptoms:  NA Psychotic Symptoms:  NA PTSD Symptoms: NA Total Time spent with patient: 20 minutes  Past Psychiatric History:    Is the patient at risk to self? Yes.    Has the patient been a risk to self in the past 6 months? Yes.    Has the patient been a risk to self within the distant past? Yes   Is the patient a risk to others? No.  Has the patient been a risk to others in the past 6 months? No.  Has the patient been a risk to others within the distant past? No.   Prior Inpatient Therapy:   Prior Outpatient Therapy:    Alcohol Screening:    Substance Abuse History in the last 12 months:  Yes.   Consequences of Substance Abuse: Withdrawal Symptoms:   None Previous Psychotropic Medications: Yes  Psychological Evaluations: Yes  Past Medical History:  Past Medical History:  Diagnosis Date  . Acid reflux   . ADHD (attention deficit hyperactivity disorder)   . Anxiety   . Hiatal hernia   . Panic attack     Past Surgical History:  Procedure Laterality Date  . LAPAROSCOPIC APPENDECTOMY N/A 08/03/2015   Procedure: APPENDECTOMY LAPAROSCOPIC;  Surgeon: Manus Rudd, MD;  Location: [facility] OR;  Service: General;  Laterality: N/A;   Family History:  Family History  Problem Relation Age of Onset  . Colon cancer Neg Hx   . Esophageal cancer Neg Hx   . Rectal cancer Neg Hx   . Stomach cancer Neg Hx    Family Psychiatric History: NA Tobacco Screening:   Social History:  Social History   Substance and Sexual Activity  Alcohol Use Yes  . Alcohol/week: 13.0 - 20.0 standard drinks  . Types: 12 - 18 Cans of beer, 1 - 2 Glasses of wine per week   Comment: daily     Social History   Substance and Sexual Activity  Drug Use Not Currently  . Types: Cocaine   Comment: occ    Additional Social History:  Allergies:  No Known Allergies Lab Results: No results found for this or any previous visit (from the past 48 hour(s)).  Blood Alcohol level:  No results found for: [facility]  Metabolic Disorder Labs:  No results found for: HGBA1C, MPG No results found for: PROLACTIN No results found for: CHOL, TRIG, HDL, CHOLHDL, VLDL, LDLCALC  Current Medications: No current facility-administered medications for this encounter.   Current Outpatient Medications  Medication Sig Dispense Refill  . ALPRAZolam (XANAX) 0.5 MG tablet TAKE ONE-HALF-1 TABLET EVERY DAY AS NEEDED FOR PANIC ATTACKS 30 tablet 2  . amphetamine-dextroamphetamine (ADDERALL XR) 25 MG 24 hr capsule Take 1 capsule by mouth every morning. 30  capsule 0  . amphetamine-dextroamphetamine (ADDERALL) 10 MG tablet Take 1 tablet (10 mg total) by mouth daily as needed. 30 tablet 0  . ibuprofen (ADVIL) 800 MG tablet Take 1 tablet (800 mg total) by mouth every 8 (eight) hours as needed for moderate pain. 21 tablet 0  . methocarbamol (ROBAXIN) 500 MG tablet Take 1 tablet (500 mg total) by mouth 2 (two) times daily. 20 tablet 0  . omeprazole (PRILOSEC) 40 MG capsule TAKE 1 CAPSULE(40 MG) BY MOUTH TWICE DAILY 30 MINUTES BEFORE MEALS 60 capsule 3  . propranolol (INDERAL) 10 MG tablet Take 1 to 2 tablets by mouth twice daily as needed for anxiety 360 tablet 0  . sertraline (ZOLOFT) 50 MG tablet Take 1/2 tab po qd x 2-4 days, then 1 tab po qd x 1 week, then 2 tabs po qd 60 tablet 1   PTA Medications: (Not in a hospital admission)   Musculoskeletal: Strength & Muscle Tone: NA Gait & Station: unsteady, unable to stand Patient leans: Left  Psychiatric Specialty Exam: Physical Exam  Review of Systems  Blood pressure (!) 153/103, pulse (!) 101, temperature 98 F (36.7 C), temperature source Oral, resp. rate 14, height 5\' 8"  (1.727 m), weight 72.6 kg, SpO2 95 %.Body mass index is 24.33 kg/m.  General Appearance: Casual  Eye Contact:  Good  Speech:  Garbled and Slurred  Volume:  Increased  Mood:  Depressed, Hopeless and Worthless  Affect:  Labile  Thought Process:  Goal Directed  Orientation:  Full (Time, Place, and Person)  Thought Content:  Logical  Suicidal Thoughts:  Yes.  with intent/plan  Homicidal Thoughts:  No  Memory:  Immediate;   Fair  Judgement:  Impaired  Insight:  Lacking  Psychomotor Activity:  Decreased  Concentration:  Concentration: Fair  Recall:  of Knowledge:  Fair  Language:  Good  Akathisia:  NA  Handed:  Right  AIMS (if indicated):     Assets:  Communication Skills Desire for Improvement Social Support Vocational/Educational  ADL's:  Impaired  Cognition:  Impaired,  Mild  Sleep:          Treatment Plan Summary: Plan:  1. Planning to keep him under observation in [place]. 2. Assessment again in the morning to check on his labs,suicide ideation and plan. 3. Depends on tomorrow's assessment he will be transferred to either Rehab or inpatient unit.  Observation Level/Precautions:  Detox Laboratory:  CBC Chemistry Profile Folic Acid GGT UDS UA Vitamin B-12 Psychotherapy:   Medications:   Consultations:   Discharge Concerns:   Estimated LOS: Other:      [place], MD 7/2/20218:04 PM

## 2019-10-04 NOTE — ED Notes (Signed)
Pt went to [facility] first, but they sent him here.

## 2019-10-04 NOTE — BH Assessment (Addendum)
Comprehensive Clinical Assessment (CCA) Screening, Triage and Referral Note  10/04/2019 Victor [name] [id number]   Disposition: Victor Chamber, NP recommends inpt psychiatric tx. Pt sent to [facility] for medical clearance Visit Diagnosis: F10.22 Alcohol Dependence; F33.2 MDD, recurrent, severe without sx of psychosis  Pt presents voluntarily to [facility] for a walk-in assessment. He is unaccompanied and reporting symptoms of alcohol dependence and depression with passive suicidal ideation. Pt has a history of years of alcohol abuse. Pt reports medication compliance. He is prescribed zoloft, xanax, and adderall and sees Victor [name] for outpt tx.   Pt reports recent passive suicidal ideation. He reports taking 1 xanax after consuming excessive amounts of alcohol, with a chance it might kill him. Pt also reports thoughts of wanting to disappear without anyone having to find his body or suffer pain of his death. Pt reports he thinks about disappearing [place] [address]. Pt denies past overt suicide attempts and no current plan for self harm. Pt denies homicidal ideation/ history of violence. He reports he does own guns but has turned them over to a friend for safety due to pt's recent mental status. Pt denies auditory & visual hallucinations & other symptoms of psychosis. Pt states current stressors include recent death of his dog.   Pt's supports include friends, brothers, sister & mother. He gave verbal authorization for collateral contact with his mother. Pt reports there is a family history of addiction. 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 Pt has above average intellect and fair to good insight and judgment. Pt's memory is intact. Legal history includes no current charges or court dates.  Protective factors against suicide include good family support, no current suicidal plan, future orientation, therapeutic relationship, no access to firearms, no current psychotic symptoms and no  prior attempts.?  Pt has no hx of inpt psychiatric tx. ? MSE: Pt is casually dressed, intoxicated, oriented x4 with somewhat slurred speech and normal motor behavior. Eye contact is good. Pt's mood is pleasant and affect is appropriate to context and congruent with mood. Thought process is coherent and relevant. There is no indication pt is currently responding to internal stimuli or experiencing delusional thought content. Pt was cooperative throughout assessment.   Disposition:  Victor Chamber, NP recommends inpt psychiatric tx. Pt sent to [facility] for medical clearance Visit Diagnosis: F10.22 Alcohol Dependence; F33.2 MDD, recurrent, severe without sx of psychosis    Patient Reported Information How did you hear about [place]? Family/Friend   Referral name: friend, mother and self   Referral phone number: No data recorded Whom do you see for routine medical problems? Primary Care   Practice/Facility Name: [facility]   Practice/Facility Phone Number: No data recorded  Name of Contact: Victor [name] How Long Has This Been Causing You Problems? > than 6 months  Have You Recently Been in Any Inpatient Treatment (Hospital/Detox/Crisis Center/28-Day Program)? No   Have You Ever Received Services From [facility] Before? Yes   Who Do You See at [facility]? Victor [name]  Have You Recently Had Any Thoughts About Hurting Yourself? Yes   Are You Planning to Commit Suicide/Harm Yourself At This time?  No  Have you Recently Had Thoughts About Hurting Someone Victor [name]? No data recorded  Explanation: No data recorded Have You Used Any Alcohol or Drugs in the Past 24 Hours? Yes   How Long Ago Did You Use Drugs or Alcohol?  0400   What Did You Use and How Much? about 12 beers  What Do You Feel Would Help You the Most Today? Other (Comment) (detox, rehab)  Do You Currently Have a Therapist/Psychiatrist? Yes   Name of Therapist/Psychiatrist: Zoila [name]   Have You Been Recently Discharged  From Any Office Practice or Programs? No    CCA Screening Triage Referral Assessment Type of Contact: Face-to-Face   Collateral Involvement: pt gave verbal permission for collateral with his mother  Is CPS involved or ever been involved? Never  Is APS involved or ever been involved? Never  Patient Determined To Be At Risk for Harm To Self or Others Based on Review of Patient Reported Information or Presenting Complaint? Yes, for Self-Harm     Does Patient Present under Involuntary Commitment? No  [place] of Residence: [place]  Patient Currently Receiving the Following Services: Medication Management;Individual Therapy   Determination of Need: Emergent (2 hours)   Options For Referral: Inpatient Hospitalization    Victor [name] Victor Nailer, LCSW

## 2019-10-04 NOTE — ED Provider Notes (Signed)
[facility]-EMERGENCY DEPT Provider Note   CSN: [id number] Arrival date & time: 10/04/19  1908     History Chief Complaint  Patient presents with  . Alcohol Problem    Victor [name] is a 29 y.o. male.  HPI Patient presents for evaluation of alcoholism.  [facility] where he was assessed by psychiatry, who felt that he had vaguely expressed suicidal ideation along with alcoholism, and wanted to observe him overnight for reassessment in the morning after he sobered up.  Patient has been despondent over death of his dog last week, and breaking up with his girlfriend prior to that.  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  He sometimes drinks before going to work.  No prior history of suicidal attempt.  He drank 18 beers since yesterday.  He reports drinking since age 75.  He denies recent problems including fever, chills, nausea, vomiting, cough, shortness of breath or chest pain.  He states that when he stops drinking he still has shaking so he typically drinks alcohol typically take away.  He states that 2 weeks ago for some reason he had a seizure, and awoke on the floor with his room in a disarray.  He is currently living with his mother because of the break-up with his girlfriend.  There are no other known modifying factors.    Past Medical History:  Diagnosis Date  . Acid reflux   . ADHD (attention deficit hyperactivity disorder)   . Anxiety   . Hiatal hernia   . Panic attack     Patient Active Problem List   Diagnosis Date Noted  . Neck pain on left side 09/26/2018  . Acute appendicitis 08/03/2015  . Attention deficit hyperactivity disorder (ADHD) 01/23/2015    Past Surgical History:  Procedure Laterality Date  . LAPAROSCOPIC APPENDECTOMY N/A 08/03/2015   Procedure: APPENDECTOMY LAPAROSCOPIC;  Surgeon: Manus Rudd, MD;  Location: [facility] OR;  Service: General;  Laterality: N/A;        Family History  Problem Relation Age of Onset  . Colon cancer Neg Hx   . Esophageal cancer Neg Hx   . Rectal cancer Neg Hx   . Stomach cancer Neg Hx     Social History   Tobacco Use  . Smoking status: Current Every Day Smoker    Packs/day: 0.50    Types: Cigarettes    Last attempt to quit: 04/01/2015    Years since quitting: 4.5  . Smokeless tobacco: Never Used  Vaping Use  . Vaping Use: Former  Substance Use Topics  . Alcohol use: Yes    Alcohol/week: 13.0 - 20.0 standard drinks    Types: 12 - 18 Cans of beer, 1 - 2 Glasses of wine per week    Comment: daily  . Drug use: Not Currently    Types: Cocaine    Comment: occ    Home Medications Prior to Admission medications   Medication Sig Start Date End Date Taking? Authorizing Provider  ALPRAZolam Prudy Feeler) 0.5 MG tablet TAKE ONE-HALF-1 TABLET EVERY DAY AS NEEDED FOR PANIC ATTACKS 08/09/19   Corie Chiquito, PMHNP  amphetamine-dextroamphetamine (ADDERALL XR) 25 MG 24 hr capsule Take 1 capsule by mouth every morning. 09/06/19   Corie Chiquito, PMHNP  amphetamine-dextroamphetamine (ADDERALL) 10 MG tablet Take 1 tablet (10 mg total) by mouth daily as needed. 06/28/19   Corie Chiquito, PMHNP  ibuprofen (ADVIL) 800 MG tablet Take 1 tablet (  800 mg total) by mouth every 8 (eight) hours as needed for moderate pain. 09/05/19   Moshe Cipro, NP  methocarbamol (ROBAXIN) 500 MG tablet Take 1 tablet (500 mg total) by mouth 2 (two) times daily. 09/05/19   Moshe Cipro, NP  omeprazole (PRILOSEC) 40 MG capsule TAKE 1 CAPSULE(40 MG) BY MOUTH TWICE DAILY 30 MINUTES BEFORE MEALS 09/05/19   Mansouraty, Netty Starring., MD  propranolol (INDERAL) 10 MG tablet Take 1 to 2 tablets by mouth twice daily as needed for anxiety 09/05/19   Corie Chiquito, PMHNP  sertraline (ZOLOFT) 50 MG tablet Take 1/2 tab po qd x 2-4 days, then 1 tab po qd x 1 week, then 2 tabs po qd 09/06/19   Corie Chiquito, PMHNP    Allergies    Patient has no known  allergies.  Review of Systems   Review of Systems  All other systems reviewed and are negative.   Physical Exam Updated Vital Signs BP (!) 153/103 (BP Location: Left Arm)   Pulse (!) 101   Temp 98 F (36.7 C) (Oral)   Resp 14   Ht 5\' 8"  (1.727 m)   Wt 72.6 kg   SpO2 95%   BMI 24.33 kg/m   Physical Exam Vitals and nursing note reviewed.  Constitutional:      Appearance: He is well-developed.  HENT:     Head: Normocephalic and atraumatic.     Right Ear: External ear normal.     Left Ear: External ear normal.  Eyes:     Conjunctiva/sclera: Conjunctivae normal.     Pupils: Pupils are equal, round, and reactive to light.  Neck:     Trachea: Phonation normal.  Cardiovascular:     Rate and Rhythm: Normal rate and regular rhythm.     Heart sounds: Normal heart sounds.  Pulmonary:     Effort: Pulmonary effort is normal.     Breath sounds: Normal breath sounds.  Abdominal:     General: Bowel sounds are normal. There is no distension.     Palpations: Abdomen is soft.     Tenderness: There is no abdominal tenderness.  Musculoskeletal:        General: Normal range of motion.     Cervical back: Normal range of motion and neck supple.  Skin:    General: Skin is warm and dry.  Neurological:     Mental Status: He is alert and oriented to person, place, and time.     Cranial Nerves: No cranial nerve deficit.     Sensory: No sensory deficit.     Motor: No abnormal muscle tone.     Coordination: Coordination normal.     Comments: Dysarthria consistent with alcohol intoxication.  No aphasia or nystagmus.  Normal gait.  Psychiatric:        Mood and Affect: Mood normal.        Behavior: Behavior normal.        Thought Content: Thought content normal.        Judgment: Judgment normal.     ED Results / Procedures / Treatments   Labs (all labs ordered are listed, but only abnormal results are displayed) Labs Reviewed  COMPREHENSIVE METABOLIC PANEL - Abnormal; Notable for the  following components:      Result Value   Total Protein 8.5 (*)    Albumin 5.1 (*)    AST 53 (*)    ALT 55 (*)    Anion gap 16 (*)    All other components  within normal limits  ETHANOL - Abnormal; Notable for the following components:   Alcohol, Ethyl (B) 332 (*)    All other components within normal limits  SARS CORONAVIRUS 2 BY RT PCR (HOSPITAL ORDER, PERFORMED IN [facility])  CBC  RAPID URINE DRUG SCREEN, HOSP PERFORMED    EKG None  Radiology No results found.  Procedures Procedures (including critical care time)  Medications Ordered in ED Medications  LORazepam (ATIVAN) injection 0-4 mg (has no administration in time range)    Or  LORazepam (ATIVAN) tablet 0-4 mg (has no administration in time range)  LORazepam (ATIVAN) injection 0-4 mg (has no administration in time range)    Or  LORazepam (ATIVAN) tablet 0-4 mg (has no administration in time range)  thiamine tablet 100 mg (has no administration in time range)    Or  thiamine (B-1) injection 100 mg (has no administration in time range)  acetaminophen (TYLENOL) tablet 650 mg (has no administration in time range)  zolpidem (AMBIEN) tablet 5 mg (has no administration in time range)  ondansetron (ZOFRAN) tablet 4 mg (has no administration in time range)  alum & mag hydroxide-simeth (MAALOX/MYLANTA) 200-200-20 MG/5ML suspension 30 mL (has no administration in time range)  nicotine (NICODERM CQ - dosed in mg/24 hours) patch 21 mg (has no administration in time range)    ED Course  I have reviewed the triage vital signs and the nursing notes.  Pertinent labs & imaging results that were available during my care of the patient were reviewed by me and considered in my medical decision making (see chart for details).  Clinical Course as of Oct 03 2105  Fri Oct 04, 2019  2102 Normal  Rapid urine drug screen (hospital performed) [EW]  2102 Normal  cbc [EW]  2102 Abnormal, high  Ethanol(!!) [EW]  2102  Normal except protein high, albumin high, AST high, ALT high,anion gap elevated  Comprehensive metabolic panel(!) [EW]  2103 At this point the patient is medically cleared for treatment by psychiatry.   [EW]    Clinical Course User Index [EW] Mancel Bale, MD   MDM Rules/Calculators/A&P                           Patient Vitals for the past 24 hrs:  BP Temp Temp src Pulse Resp SpO2 Height Weight  10/04/19 1938 (!) 153/103 98 F (36.7 C) Oral (!) 101 14 95 % 5\' 8"  (1.727 m) 72.6 kg    9:07 PM Reevaluation with update and discussion. After initial assessment and treatment, an updated evaluation reveals no change in status, findings discussed with patient and mother, all questions answered.   Medical Decision Making:  This patient is presenting for evaluation of alcoholism with suicidal ideation, which does require a range of treatment options, and is a complaint that involves a moderate risk of morbidity and mortality. The differential diagnoses include acute alcohol intoxication, other intoxication, suicidal ideation. I decided to review old records, and in summary chronic alcoholism with current stressors, leading to risk for suicide.  He has been seeing a counselor at behavioral [facility], for various problems including anxiety, depression and sleeplessness.  I obtained additional historical information from his mother at the bedside.  Clinical Laboratory Tests Ordered, included CBC, Metabolic panel, Urinalysis and Alcohol level, UDS. Review indicates normal except elevated alcohol level. Radiologic Tests Ordered, included normal except alcohol level elevated.    Critical Interventions-clinical evaluation, laboratory testing,  observation reassessment   After These Interventions, the Patient was reevaluated and was found to have alcoholism and suicidal ideation.  Patient evaluated by psychiatry who wants to have him admitted for observation  overnight.   CRITICAL CARE-no Performed by: Mancel BaleElliott China Deitrick  Nursing Notes Reviewed/ Care Coordinated Applicable Imaging Reviewed Interpretation of Laboratory Data incorporated into ED treatment   Plan disposition per TTS in conjunction with oncoming provider team    Final Clinical Impression(s) / ED Diagnoses Final diagnoses:  Alcohol abuse  Alcoholic intoxication without complication (HCC)  Suicidal ideation    Rx / DC Orders ED Discharge Orders    None       Mancel BaleWentz, Oney Tatlock, MD 10/04/19 2108

## 2019-10-04 NOTE — ED Notes (Signed)
Patient changed into paper scrubs and belongings taken by mother.

## 2019-10-04 NOTE — ED Notes (Signed)
Family at bedside. 

## 2019-10-04 NOTE — ED Notes (Signed)
Patient wanded by security. 

## 2019-10-04 NOTE — BH Assessment (Signed)
Victor [name], [facility], confirmed adult unit is at capacity. TTS will contact facilities for placement.   Pamalee Leyden, [facility], [facility] Triage Specialist [phone]

## 2019-10-04 NOTE — ED Notes (Addendum)
Date and time results received: 10/04/19 8:41 PM  Test: ETOH Critical Value: 332  Name of Provider Notified: Effie Shy  Orders Received? Or Actions Taken?: Actions Taken: MD made aware

## 2019-10-05 ENCOUNTER — Inpatient Hospital Stay (HOSPITAL_COMMUNITY)
Admission: EM | Admit: 2019-10-05 | Discharge: 2019-10-09 | DRG: 885 | Disposition: A | Discharge status: Home / Self Care | Payer: Commercial Managed Care - PPO | Source: Intra-hospital | Attending: Psychiatry | Admitting: Psychiatry

## 2019-10-05 ENCOUNTER — Encounter (HOSPITAL_COMMUNITY): Payer: Self-pay | Admitting: Psychiatry

## 2019-10-05 ENCOUNTER — Other Ambulatory Visit: Payer: Self-pay

## 2019-10-05 DIAGNOSIS — Z20822 Contact with and (suspected) exposure to covid-19: Secondary | ICD-10-CM | POA: Diagnosis present

## 2019-10-05 DIAGNOSIS — F322 Major depressive disorder, single episode, severe without psychotic features: Secondary | ICD-10-CM | POA: Diagnosis present

## 2019-10-05 DIAGNOSIS — Y908 Blood alcohol level of 240 mg/100 ml or more: Secondary | ICD-10-CM | POA: Diagnosis present

## 2019-10-05 DIAGNOSIS — F1020 Alcohol dependence, uncomplicated: Secondary | ICD-10-CM | POA: Diagnosis present

## 2019-10-05 DIAGNOSIS — F909 Attention-deficit hyperactivity disorder, unspecified type: Secondary | ICD-10-CM | POA: Diagnosis present

## 2019-10-05 DIAGNOSIS — F419 Anxiety disorder, unspecified: Secondary | ICD-10-CM | POA: Diagnosis present

## 2019-10-05 DIAGNOSIS — F10229 Alcohol dependence with intoxication, unspecified: Secondary | ICD-10-CM | POA: Diagnosis present

## 2019-10-05 DIAGNOSIS — F17210 Nicotine dependence, cigarettes, uncomplicated: Secondary | ICD-10-CM | POA: Diagnosis present

## 2019-10-05 DIAGNOSIS — R45851 Suicidal ideations: Secondary | ICD-10-CM | POA: Diagnosis present

## 2019-10-05 LAB — SARS CORONAVIRUS 2 BY RT PCR (HOSPITAL ORDER, PERFORMED IN ~~LOC~~ HOSPITAL LAB): SARS Coronavirus 2: NEGATIVE

## 2019-10-05 MED ORDER — LOPERAMIDE HCL 2 MG PO CAPS: 2.0000 mg | ORAL_CAPSULE | ORAL | Status: AC | PRN

## 2019-10-05 MED ORDER — MAGNESIUM HYDROXIDE 400 MG/5ML PO SUSP: 30.0000 mL | Freq: Every day | ORAL | Status: DC | PRN

## 2019-10-05 MED ORDER — LORAZEPAM 1 MG PO TABS: 1.0000 mg | ORAL_TABLET | Freq: Three times a day (TID) | ORAL | Status: DC

## 2019-10-05 MED ORDER — THIAMINE HCL 100 MG/ML IJ SOLN: 100.0000 mg | Freq: Once | INTRAMUSCULAR | Status: DC

## 2019-10-05 MED ORDER — ADULT MULTIVITAMIN W/MINERALS CH: 1.0000 | ORAL_TABLET | Freq: Every day | ORAL | Status: DC

## 2019-10-05 MED ORDER — TRAZODONE HCL 50 MG PO TABS
50.0000 mg | ORAL_TABLET | Freq: Every evening | ORAL | Status: DC | PRN
  Administered 2019-10-06 – 2019-10-08 (×3): 50 mg via ORAL
  Filled 2019-10-05 (×4): qty 1

## 2019-10-05 MED ORDER — LORAZEPAM 1 MG PO TABS: 1.0000 mg | ORAL_TABLET | Freq: Every day | ORAL | Status: DC

## 2019-10-05 MED ORDER — ACETAMINOPHEN 325 MG PO TABS
650.0000 mg | ORAL_TABLET | Freq: Four times a day (QID) | ORAL | Status: DC | PRN
Start: 2019-10-05 — End: 2019-10-05

## 2019-10-05 MED ORDER — LORAZEPAM 1 MG PO TABS
1.0000 mg | ORAL_TABLET | Freq: Two times a day (BID) | ORAL | Status: DC
Start: 2019-10-07 — End: 2019-10-05

## 2019-10-05 MED ORDER — SERTRALINE HCL 100 MG PO TABS
100.0000 mg | ORAL_TABLET | Freq: Every day | ORAL | Status: DC
  Administered 2019-10-05 – 2019-10-09 (×5): 100 mg via ORAL
  Filled 2019-10-05 (×7): qty 1

## 2019-10-05 MED ORDER — LORAZEPAM 1 MG PO TABS: 1.0000 mg | ORAL_TABLET | ORAL | Status: DC | PRN

## 2019-10-05 MED ORDER — THIAMINE HCL 100 MG PO TABS: 100.0000 mg | ORAL_TABLET | Freq: Every day | ORAL | Status: DC

## 2019-10-05 MED ORDER — ONDANSETRON 4 MG PO TBDP: 4.0000 mg | ORAL_TABLET | Freq: Once | ORAL | Status: AC

## 2019-10-05 MED ORDER — FOLIC ACID 1 MG PO TABS
1.0000 mg | ORAL_TABLET | Freq: Every day | ORAL | Status: DC
  Administered 2019-10-06 – 2019-10-09 (×4): 1 mg via ORAL
  Filled 2019-10-05 (×5): qty 1

## 2019-10-05 MED ORDER — HYDROXYZINE HCL 25 MG PO TABS
25.0000 mg | ORAL_TABLET | Freq: Four times a day (QID) | ORAL | Status: AC | PRN
  Administered 2019-10-07: 25 mg via ORAL
  Filled 2019-10-05 (×3): qty 1

## 2019-10-05 MED ORDER — LORAZEPAM 2 MG/ML IJ SOLN: 1.0000 mg | INTRAMUSCULAR | Status: DC | PRN

## 2019-10-05 MED ORDER — LORAZEPAM 1 MG PO TABS
1.0000 mg | ORAL_TABLET | Freq: Four times a day (QID) | ORAL | Status: AC | PRN
  Administered 2019-10-06 – 2019-10-07 (×5): 1 mg via ORAL
  Filled 2019-10-05 (×6): qty 1

## 2019-10-05 MED ORDER — LORAZEPAM 1 MG PO TABS: 1.0000 mg | ORAL_TABLET | Freq: Four times a day (QID) | ORAL | Status: DC

## 2019-10-05 MED ORDER — ACETAMINOPHEN 325 MG PO TABS: 650.0000 mg | ORAL_TABLET | Freq: Four times a day (QID) | ORAL | Status: DC | PRN

## 2019-10-05 MED ORDER — THIAMINE HCL 100 MG PO TABS
100.0000 mg | ORAL_TABLET | Freq: Every day | ORAL | Status: DC
  Administered 2019-10-06 – 2019-10-09 (×4): 100 mg via ORAL
  Filled 2019-10-05 (×5): qty 1

## 2019-10-05 MED ORDER — NICOTINE POLACRILEX 2 MG MT GUM
2.0000 mg | CHEWING_GUM | OROMUCOSAL | Status: DC | PRN
  Administered 2019-10-06 – 2019-10-07 (×6): 2 mg via ORAL
  Filled 2019-10-05: qty 1

## 2019-10-05 MED ORDER — ONDANSETRON 4 MG PO TBDP
4.0000 mg | ORAL_TABLET | Freq: Four times a day (QID) | ORAL | Status: AC | PRN
  Filled 2019-10-05: qty 1

## 2019-10-05 MED ORDER — ALUM & MAG HYDROXIDE-SIMETH 200-200-20 MG/5ML PO SUSP: 30.0000 mL | ORAL | Status: DC | PRN

## 2019-10-05 MED ORDER — ONDANSETRON 4 MG PO TBDP
ORAL_TABLET | ORAL | Status: AC
  Administered 2019-10-05: 4 mg
  Filled 2019-10-05: qty 1

## 2019-10-05 MED ORDER — FOLIC ACID 1 MG PO TABS: 1.0000 mg | ORAL_TABLET | Freq: Every day | ORAL | Status: DC

## 2019-10-05 MED ORDER — THIAMINE HCL 100 MG/ML IJ SOLN: 100.0000 mg | Freq: Every day | INTRAMUSCULAR | Status: DC

## 2019-10-05 MED ORDER — ENSURE ENLIVE PO LIQD
237.0000 mL | Freq: Two times a day (BID) | ORAL | Status: DC
  Administered 2019-10-06: 237 mL via ORAL

## 2019-10-05 MED ORDER — MAGNESIUM HYDROXIDE 400 MG/5ML PO SUSP
30.0000 mL | Freq: Every day | ORAL | Status: DC | PRN
Start: 2019-10-05 — End: 2019-10-05

## 2019-10-05 NOTE — Progress Notes (Signed)
   10/05/19 2255  COVID-19 Daily Checkoff  Have you had a fever (temp > 37.80C/100F)  in the past 24 hours?  No  If you have had runny nose, nasal congestion, sneezing in the past 24 hours, has it worsened? No  COVID-19 EXPOSURE  Have you traveled outside the state in the past 14 days? No  Have you been in contact with someone with a confirmed diagnosis of COVID-19 or PUI in the past 14 days without wearing appropriate PPE? No  Have you been living in the same home as a person with confirmed diagnosis of COVID-19 or a PUI (household contact)? No  Have you been diagnosed with COVID-19? No

## 2019-10-05 NOTE — Progress Notes (Signed)
   10/05/19 2305  Psych Admission Type (Psych Patients Only)  Admission Status Voluntary  Psychosocial Assessment  Patient Complaints Substance abuse  Eye Contact Fair  Facial Expression Flat  Affect Appropriate to circumstance  Speech Logical/coherent  Interaction Assertive  Motor Activity Other (Comment) (WDL)  Appearance/Hygiene Disheveled  Behavior Characteristics Appropriate to situation  Mood Depressed  Thought Process  Coherency WDL  Content WDL  Delusions None reported or observed  Perception WDL  Hallucination None reported or observed  Judgment Poor  Confusion None  Danger to Self  Current suicidal ideation? Denies  Danger to Others  Danger to Others None reported or observed

## 2019-10-05 NOTE — Progress Notes (Signed)
Patient admitted to the adult unit for substance abuse. He reported drinking 12-18 beers daily along with wine. He reported drinking alcohol since age 29. He reported drinking heavily for the last two years. He reported seeking treatment now because he doesn't want to be an alcoholic anymore. He denies SI on admission and appears to be minimizing. He reported that he jokingly told his friend to end his life. He reported withdrawal symptoms of shaking, cold sweats, nausea, mild headaches and anxiety. CIWA =6. He denies AVH and does not appear to be responding to internal stimuli.   He reported no prior psychiatric hospitalizations, suicide attempts or substance abuse tx. He reported that his medications are prescribed by [facility]. His current medications include Zoloft, adderall and xanax.

## 2019-10-05 NOTE — BHH Suicide Risk Assessment (Signed)
[facility] Admission Suicide Risk Assessment   Nursing information obtained from:  Patient Demographic factors:  Male, Caucasian Current Mental Status:  Self-harm behaviors Loss Factors:  NA Historical Factors:  Impulsivity Risk Reduction Factors:  Sense of responsibility to family, Employed  Total Time spent with patient: 20 minutes Principal Problem: <principal problem not specified> Diagnosis:  Active Problems:   Alcohol use disorder, severe, dependence (HCC)   Major depressive disorder, single episode, severe without psychosis (HCC)  Subjective Data: Patient is a 29 year old male who originally presented to the behavioral health center assessment services on 10/04/2019 requesting placement in an alcohol rehabilitation facility.  He was noted to be intoxicated at the time of evaluation, and the patient was sent to the [facility] emergency department for medical clearance.  Apparently he did not go to the hospital but returned to the observation unit again intoxicated.  He was then sent to the [facility] emergency room.  In the emergency room his blood alcohol was found to be 332.  His AST and ALT were both elevated.  There is a note in the chart that stated that he had "vague suicidal ideation".  He denies suicidal ideation on the floor, and stated that he came to the hospital to be admitted to an alcohol rehabilitation facility.  He is seen by a nurse practitioner in the community for psychiatric reasons and has been prescribed Xanax and Adderall in the past.  His last Xanax prescription was from 09/17/2019 for 30 0.5 mg tablets and also Adderall extended release 25 mg and he received 30 of those.  It appears that he has been on Adderall and Xanax for at least 1 year.  After having been monitored in the emergency department he was transferred to the psychiatric facility for admission and evaluation.  He was most recently started on Zoloft for depression sometime in June  2021.  Continued Clinical Symptoms:  Alcohol Use Disorder Identification Test Final Score (AUDIT): 33 The "Alcohol Use Disorders Identification Test", Guidelines for Use in Primary Care, Second Edition.  World Science writer [facility]). Score between 0-7:  no or low risk or alcohol related problems. Score between 8-15:  moderate risk of alcohol related problems. Score between 16-19:  high risk of alcohol related problems. Score 20 or above:  warrants further diagnostic evaluation for alcohol dependence and treatment.   CLINICAL FACTORS:   Depression:   Anhedonia Comorbid alcohol abuse/dependence Impulsivity Alcohol/Substance Abuse/Dependencies   Musculoskeletal: Strength & Muscle Tone: within normal limits Gait & Station: normal Patient leans: N/A  Psychiatric Specialty Exam: Physical Exam Vitals and nursing note reviewed.  Constitutional:      Appearance: Normal appearance.  HENT:     Head: Normocephalic and atraumatic.  Pulmonary:     Effort: Pulmonary effort is normal.  Neurological:     General: No focal deficit present.     Mental Status: He is alert.     Review of Systems  Blood pressure (!) 148/96, pulse 74, temperature 98.4 F (36.9 C), temperature source Oral, resp. rate 16, height 5\' 8"  (1.727 m), weight 72.6 kg, SpO2 99 %.Body mass index is 24.33 kg/m.  General Appearance: Disheveled  Eye Contact:  Fair  Speech:  Normal Rate  Volume:  Normal  Mood:  Anxious and Depressed  Affect:  Congruent  Thought Process:  Coherent and Descriptions of Associations: Intact  Orientation:  Full (Time, Place, and Person)  Thought Content:  Logical  Suicidal Thoughts:  No  Homicidal Thoughts:  No  Memory:  Immediate;   Fair Recent;   Fair Remote;   Fair  Judgement:  Intact  Insight:  Fair  Psychomotor Activity:  Normal  Concentration:  Concentration: Fair and Attention Span: Fair  Recall:  [employer] of Knowledge:  Fair  Language:  Fair  Akathisia:  Negative   Handed:  Right  AIMS (if indicated):     Assets:  Desire for Improvement Resilience  ADL's:  Intact  Cognition:  WNL  Sleep:         COGNITIVE FEATURES THAT CONTRIBUTE TO RISK:  None    SUICIDE RISK:   Minimal: No identifiable suicidal ideation.  Patients presenting with no risk factors but with morbid ruminations; may be classified as minimal risk based on the severity of the depressive symptoms  PLAN OF CARE: Patient is seen and examined.  Patient is a 29 year old male with the above-stated past psychiatric history who will be admitted to the hospital.  He will be admitted to the hospital, he will be integrated in the milieu, and he will be encouraged to attend groups.  He will be placed on lorazepam 1 mg p.o. every 6 hours as needed a CIWA greater than 10.  He will also be given folic acid as well as thiamine.  We will continue his Zoloft.  He will also have hydroxyzine for anxiety as well as trazodone for sleep.  We will place him on seizure precautions for safety sake.  He will meet with social work to discuss options for substance abuse rehabilitation facilities.  Review of his laboratories revealed mildly elevated AST at 53 and a mildly elevated ALT at 55.  His CBC was completely normal.  As stated above his blood alcohol was 332.  Drug screen was negative.  EKG was not done.  His blood pressure on arrival to the facility was mildly elevated at 148/96.  Heart rate was 74.  He is afebrile.  Oxygen saturation was 99% on room air.  I certify that inpatient services furnished can reasonably be expected to improve the patient's condition.   Antonieta Pert, MD 10/05/2019, 3:55 PM

## 2019-10-05 NOTE — Tx Team (Signed)
Initial Treatment Plan 10/05/2019 3:27 PM Victor [name] NID:[id number]    PATIENT STRESSORS: Occupational concerns Substance abuse   PATIENT STRENGTHS: Ability for insight Capable of independent living   PATIENT IDENTIFIED PROBLEMS: "Depression"  "Alcoholism"                    DISCHARGE CRITERIA:  Ability to meet basic life and health needs Adequate post-discharge living arrangements Improved stabilization in mood, thinking, and/or behavior  PRELIMINARY DISCHARGE PLAN: Attend aftercare/continuing care group Attend PHP/IOP  PATIENT/FAMILY INVOLVEMENT: This treatment plan has been presented to and reviewed with the patient, Victor [name], and/or family member.  The patient and family have been given the opportunity to ask questions and make suggestions.  Layla Barter, RN 10/05/2019, 3:27 PM

## 2019-10-05 NOTE — BHH Group Notes (Signed)
Pt attended and participated in wrap up group this evening. Pt was in bed sleeping.

## 2019-10-05 NOTE — H&P (Addendum)
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 At evaluation he was too confused and drunk and had vague thoughts of suicidal. Pt was sent to ED for evaluation and was admitted today to [facility] for management of depression symptoms and Detox. Pt has been drinking since Age 9. Pt states he came here for Rehab and was not sure about suicidal thoughts as he was drunk and confused yesterday. Pt states he has been depressed for his whole life off and on and has been taking Zoloft 100 mg for last 2 weeks. Pt has been seen a therapist and a Therapist, sports at [employer] over Psychiatry. Pt states his dog died recently and he broke up with his girl friend which has worsen his alcohol problem, depression and anxiety. Pt states he drinks 12-18 Beers with wine every night. Pt takes Adderall 25 mg for ADHD to improve concentration which was started 8 years ago. Pt reports low and depressed mood, low energy, anhedonia, and difficulty in sleep. Pt states he generally has 5-6 hours of sleep overnight. Pt denies any suicidal and homicidal ideation. Pt denies episodes of high energy, increased grandiosity and, visual, auditory and tactile hallucinations. Pt states sometimes he drinks first thing in the morning and feels guilty about drinking. Pt states one night he woke up on the floor and not  sure of he had seizure. Pt states he tried to cut down on his alcohol and has tried AA 6 years ago but it didn't help. Pt has no access to weapons and feels safe outside home. Pt denies any previous suicidal attempts or thoughts.In the ED, CBC, LFT and Comprehensive panel was done. CBC, LFT's were normal. Glucose was 99, Anion gap was 16 and Urine toxicoloy was negative. His BAL on admission was 332.  Associated Signs/Symptoms: Depression Symptoms:  depressed mood, feelings of worthlessness/guilt, difficulty concentrating, anxiety, (Hypo) Manic Symptoms:  Distractibility, Anxiety Symptoms:  Social Anxiety, Psychotic Symptoms:  None PTSD Symptoms: Negative Total Time spent with patient: 1 hour  Past Psychiatric History: ADHD, Anxiety, Depression  Is the patient at risk to self? No.  Has the patient been a risk to self in the past 6 months? Yes.    Has the patient been a risk to self within the distant past? No.  Is the patient a risk to others? No.  Has the patient been a risk to others in the past 6 months? No.  Has the patient been a risk to others within the distant past? No.   Prior Inpatient Therapy:   Prior Outpatient Therapy:    Alcohol Screening: 1. How often do you have a drink containing alcohol?: 4 or more times a week 2. How many drinks containing alcohol do you have on a typical day when you are drinking?: 10 or more 3. How often do you have six or more drinks on one occasion?: Daily or almost daily AUDIT-C Score: 12 4. How often during the last year have  you found that you were not able to stop drinking once you had started?: Daily or almost daily 5. How often during the last year have you failed to do what was normally expected from you because of drinking?: Monthly 6. How often during the last year have you needed a first drink in the morning to get yourself going after a heavy drinking session?: Daily or almost daily 7. How often during the last year have you had a  feeling of guilt of remorse after drinking?: Daily or almost daily 8. How often during the last year have you been unable to remember what happened the night before because you had been drinking?: Weekly 9. Have you or someone else been injured as a result of your drinking?: No 10. Has a relative or friend or a doctor or another health worker been concerned about your drinking or suggested you cut down?: Yes, during the last year Alcohol Use Disorder Identification Test Final Score (AUDIT): 33 Alcohol Brief Interventions/Follow-up: Alcohol Education, Brief Advice Substance Abuse History in the last 12 months:  Yes.  Alcohol Abuse Disorder Consequences of Substance Abuse: Legal Consequences:  Underage drinking charges at 20 Previous Psychotropic Medications: No  Psychological Evaluations: No  Past Medical History:  Past Medical History:  Diagnosis Date  . Acid reflux   . ADHD (attention deficit hyperactivity disorder)   . Anxiety   . Hiatal hernia   . Panic attack     Past Surgical History:  Procedure Laterality Date  . LAPAROSCOPIC APPENDECTOMY N/A 08/03/2015   Procedure: APPENDECTOMY LAPAROSCOPIC;  Surgeon: Manus Rudd, MD;  Location: [facility] OR;  Service: General;  Laterality: N/A;   Family History:  Family History  Problem Relation Age of Onset  . Colon cancer Neg Hx   . Esophageal cancer Neg Hx   . Rectal cancer Neg Hx   . Stomach cancer Neg Hx    Family Psychiatric  History: None Tobacco Screening: Have you used any form of tobacco in the last 30 days? (Cigarettes, Smokeless Tobacco, Cigars, and/or Pipes): Yes Tobacco use, Select all that apply: 5 or more cigarettes per day Are you interested in Tobacco Cessation Medications?: Yes, will notify MD for an order Counseled patient on smoking cessation including recognizing danger situations, developing coping skills and basic information about quitting provided: Yes Social History:  Social History   Substance and Sexual Activity   Alcohol Use Yes  . Alcohol/week: 13.0 - 20.0 standard drinks  . Types: 12 - 18 Cans of beer, 1 - 2 Glasses of wine per week   Comment: daily     Social History   Substance and Sexual Activity  Drug Use Not Currently  . Types: Cocaine   Comment: occ    Additional Social History:                           Allergies:  No Known Allergies Lab Results:  Results for orders placed or performed during the hospital encounter of 10/04/19 (from the past 48 hour(s))  Comprehensive metabolic panel     Status: Abnormal   Collection Time: 10/04/19  7:47 PM  Result Value Ref Range   Sodium 139 135 - 145 mmol/L   Potassium 4.5 3.5 - 5.1 mmol/L   Chloride 101 98 - 111 mmol/L   CO2 22 22 - 32 mmol/L   Glucose, Bld 99 70 - 99 mg/dL    Comment: Glucose reference range applies only to samples taken  after fasting for at least 8 hours.   BUN 11 6 - 20 mg/dL   Creatinine, Ser 1.610.93 0.61 - 1.24 mg/dL   Calcium 9.0 8.9 - 09.610.3 mg/dL   Total Protein 8.5 (H) 6.5 - 8.1 g/dL   Albumin 5.1 (H) 3.5 - 5.0 g/dL   AST 53 (H) 15 - 41 U/L   ALT 55 (H) 0 - 44 U/L   Alkaline Phosphatase 59 38 - 126 U/L   Total Bilirubin 0.6 0.3 - 1.2 mg/dL   GFR calc non Af Amer >60 >60 mL/min   GFR calc Af Amer >60 >60 mL/min   Anion gap 16 (H) 5 - 15    Comment: Performed at [facility], [address] [place], [place] [address]  Ethanol     Status: Abnormal   Collection Time: 10/04/19  7:47 PM  Result Value Ref Range   Alcohol, Ethyl (B) 332 (HH) <10 mg/dL    Comment: CRITICAL RESULT CALLED TO, READ BACK BY AND VERIFIED WITH: LEONARD,S. RN @2039  10/04/19 BILLINGSLEY,L (NOTE) Lowest detectable limit for serum alcohol is 10 mg/dL.  For medical purposes only. Performed at [facility], [address] [place], [place] [address]   cbc     Status: None   Collection Time: 10/04/19  7:47 PM  Result Value Ref Range   WBC 6.5 4.0 - 10.5 K/uL   RBC 5.25 4.22 - 5.81 MIL/uL    Hemoglobin 16.1 13.0 - 17.0 g/dL   HCT 14.746.0 39 - 52 %   MCV 87.6 80.0 - 100.0 fL   MCH 30.7 26.0 - 34.0 pg   MCHC 35.0 30.0 - 36.0 g/dL   RDW 82.912.7 56.211.5 - 13.015.5 %   Platelets 241 150 - 400 K/uL   nRBC 0.0 0.0 - 0.2 %    Comment: Performed at [facility], [address] [place], [place] [address]  Rapid urine drug screen (hospital performed)     Status: None   Collection Time: 10/04/19  8:00 PM  Result Value Ref Range   Opiates NONE DETECTED NONE DETECTED   Cocaine NONE DETECTED NONE DETECTED   Benzodiazepines NONE DETECTED NONE DETECTED   Amphetamines NONE DETECTED NONE DETECTED   Tetrahydrocannabinol NONE DETECTED NONE DETECTED   Barbiturates NONE DETECTED NONE DETECTED    Comment: (NOTE) DRUG SCREEN FOR MEDICAL PURPOSES ONLY.  IF CONFIRMATION IS NEEDED FOR ANY PURPOSE, NOTIFY LAB WITHIN 5 DAYS.  LOWEST DETECTABLE LIMITS FOR URINE DRUG SCREEN Drug Class                     Cutoff (ng/mL) Amphetamine and metabolites    1000 Barbiturate and metabolites    200 Benzodiazepine                 200 Tricyclics and metabolites     300 Opiates and metabolites        300 Cocaine and metabolites        300 THC                            50 Performed at [facility], [address] [place], [place] [address]   SARS Coronavirus 2 by RT PCR (hospital order, performed in [facility] hospital lab) Nasopharyngeal Nasopharyngeal Swab     Status: None   Collection Time: 10/04/19 11:44 PM   Specimen: Nasopharyngeal Swab  Result Value Ref Range  SARS Coronavirus 2 NEGATIVE NEGATIVE    Comment: (NOTE) SARS-CoV-2 target nucleic acids are NOT DETECTED.  The SARS-CoV-2 RNA is generally detectable in upper and lower respiratory specimens during the acute phase of infection. The lowest concentration of SARS-CoV-2 viral copies this assay can detect is 250 copies / mL. A negative result does not preclude SARS-CoV-2 infection and should not be used as the  sole basis for treatment or other patient management decisions.  A negative result may occur with improper specimen collection / handling, submission of specimen other than nasopharyngeal swab, presence of viral mutation(s) within the areas targeted by this assay, and inadequate number of viral copies (<250 copies / mL). A negative result must be combined with clinical observations, patient history, and epidemiological information.  Fact Sheet for Patients:   BoilerBrush.com.cy  Fact Sheet for Healthcare Providers: https://pope.com/  This test is not yet approved or  cleared by the [place] FDA and has been authorized for detection and/or diagnosis of SARS-CoV-2 by FDA under an Emergency Use Authorization (EUA).  This EUA will remain in effect (meaning this test can be used) for the duration of the COVID-19 declaration under Section 564(b)(1) of the Act, 21 U.S.C. section 360bbb-3(b)(1), unless the authorization is terminated or revoked sooner.  Performed at [facility], [address], [place], [place] [address]     Blood Alcohol level:  Lab Results  Component Value Date   ETH 332 [facility]) 10/04/2019    Metabolic Disorder Labs:  No results found for: HGBA1C, MPG No results found for: PROLACTIN No results found for: CHOL, TRIG, HDL, CHOLHDL, VLDL, LDLCALC  Current Medications: Current Facility-Administered Medications  Medication Dose Route Frequency Provider Last Rate Last Admin  . acetaminophen (TYLENOL) tablet 650 mg  650 mg Oral Q6H PRN Antonieta Pert, MD      . alum & mag hydroxide-simeth (MAALOX/MYLANTA) 200-200-20 MG/5ML suspension 30 mL  30 mL Oral Q4H PRN Antonieta Pert, MD      . hydrOXYzine (ATARAX/VISTARIL) tablet 25 mg  25 mg Oral Q6H PRN Charm Rings, NP      . loperamide (IMODIUM) capsule 2-4 mg  2-4 mg Oral PRN Charm Rings, NP      . LORazepam (ATIVAN) tablet 1 mg  1 mg Oral  Q6H PRN Charm Rings, NP      . magnesium hydroxide (MILK OF MAGNESIA) suspension 30 mL  30 mL Oral Daily PRN Charm Rings, NP      . ondansetron (ZOFRAN-ODT) disintegrating tablet 4 mg  4 mg Oral Q6H PRN Charm Rings, NP      . sertraline (ZOLOFT) tablet 100 mg  100 mg Oral Daily Daevion Navarette, MD      . traZODone (DESYREL) tablet 50 mg  50 mg Oral QHS PRN Antonieta Pert, MD       Facility-Administered Medications Ordered in Other Encounters  Medication Dose Route Frequency Provider Last Rate Last Admin  . folic acid (FOLVITE) tablet 1 mg  1 mg Oral Daily Lord, Jamison Y, NP      . LORazepam (ATIVAN) tablet 1-4 mg  1-4 mg Oral Q1H PRN Charm Rings, NP       Or  . LORazepam (ATIVAN) injection 1-4 mg  1-4 mg Intravenous Q1H PRN Charm Rings, NP      . multivitamin with minerals tablet 1 tablet  1 tablet Oral Daily Charm Rings, NP      . thiamine tablet  100 mg  100 mg Oral Daily Charm Rings, NP       Or  . thiamine (B-1) injection 100 mg  100 mg Intravenous Daily Charm Rings, NP       PTA Medications: Medications Prior to Admission  Medication Sig Dispense Refill Last Dose  . ALPRAZolam (XANAX) 0.5 MG tablet TAKE ONE-HALF-1 TABLET EVERY DAY AS NEEDED FOR PANIC ATTACKS 30 tablet 2   . amphetamine-dextroamphetamine (ADDERALL XR) 25 MG 24 hr capsule Take 1 capsule by mouth every morning. 30 capsule 0   . amphetamine-dextroamphetamine (ADDERALL) 10 MG tablet Take 1 tablet (10 mg total) by mouth daily as needed. 30 tablet 0   . ibuprofen (ADVIL) 800 MG tablet Take 1 tablet (800 mg total) by mouth every 8 (eight) hours as needed for moderate pain. 21 tablet 0   . methocarbamol (ROBAXIN) 500 MG tablet Take 1 tablet (500 mg total) by mouth 2 (two) times daily. 20 tablet 0   . omeprazole (PRILOSEC) 40 MG capsule TAKE 1 CAPSULE(40 MG) BY MOUTH TWICE DAILY 30 MINUTES BEFORE MEALS (Patient taking differently: Take 40 mg by mouth 2 (two) times daily. ) 60 capsule 3   .  propranolol (INDERAL) 10 MG tablet Take 1 to 2 tablets by mouth twice daily as needed for anxiety 360 tablet 0   . sertraline (ZOLOFT) 50 MG tablet Take 1/2 tab po qd x 2-4 days, then 1 tab po qd x 1 week, then 2 tabs po qd (Patient taking differently: Take 100 mg by mouth daily. ) 60 tablet 1     Musculoskeletal: Strength & Muscle Tone: within normal limits Gait & Station: normal Patient leans: N/A  Psychiatric Specialty Exam: Physical Exam  Review of Systems  Blood pressure (!) 148/96, pulse 74, temperature 98.4 F (36.9 C), temperature source Oral, resp. rate 16, height 5\' 8"  (1.727 m), weight 72.6 kg, SpO2 99 %.Body mass index is 24.33 kg/m.  General Appearance: Casual  Eye Contact:  Fair  Speech:  Slow  Volume:  Decreased  Mood:  Depressed  Affect:  Constricted and Depressed  Thought Process:  Coherent  Orientation:  Full (Time, Place, and Person)  Thought Content:  Logical  Suicidal Thoughts:  No  Homicidal Thoughts:  No  Memory:  Immediate;   Good  Judgement:  Intact  Insight:  Good and Fair  Psychomotor Activity:  Negative  Concentration:  Concentration: Fair  Recall:  Fair  Fund of Knowledge:  Good  Language:  Good  Akathisia:  No  Handed:  Right  AIMS (if indicated):     Assets:  Desire for Improvement Social Support Talents/Skills  ADL's:  Intact  Cognition:  WNL  Sleep:       Treatment Plan Summary: Daily contact with patient to assess and evaluate symptoms and progress in treatment  Pt is a 29 year old male with above mentioned past medical and psychiatric history. Pt  Is here for management for depression symptoms and detox treatment. Pt was taking Zoloft 100 mg for last 2 weeks. Pt takes Adderall 25 mg for ADHD to improve concentration. Pt reports low and depressed mood, low energy, anhedonia, and difficulty in sleep. Pt states he generally has 5-6 hours of sleep overnight. Pt denies any suicidal and homicidal ideation. Pt denies episodes of high energy,  increased grandiosity and, visual, auditory and tactile hallucinations. Pt states sometimes he drinks first thing in the morning and feels guilty about drinking. In the ED, CBC, LFT and Comprehensive  panel was done. CBC, LFT's were normal. Glucose was 99, Anion gap was 16 and Urine toxicoloy was negative. His BAL on admission was 332. We will start Detox protocol. Monitor for withdrawal symptoms, seizures, and suicidal symptoms. Monitor vitals. -Thiamin 100 mg PO added. -Folic acid 1 mg OD added. -Zoloft 100 mg started today.  Observation Level/Precautions:  Detox 15 minute checks  Laboratory:    Psychotherapy:    Medications:    Consultations:    Discharge Concerns:    Estimated LOS:  Other:     Physician Treatment Plan for Primary Diagnosis: Alcohol Abuse disorder, Depression Long Term Goal(s): Improvement in symptoms so as ready for discharge  Short Term Goals: Ability to identify changes in lifestyle to reduce recurrence of condition will improve, Ability to verbalize feelings will improve, Ability to disclose and discuss suicidal ideas, Ability to demonstrate self-control will improve, Ability to identify and develop effective coping behaviors will improve, Ability to maintain clinical measurements within normal limits will improve and Compliance with prescribed medications will improve  Physician Treatment Plan for Secondary Diagnosis: Anxiety Long Term Goal(s): Improvement in symptoms so as ready for discharge  Short Term Goals: Ability to verbalize feelings will improve, Ability to disclose and discuss suicidal ideas, Ability to demonstrate self-control will improve, Ability to identify and develop effective coping behaviors will improve, Ability to maintain clinical measurements within normal limits will improve and Compliance with prescribed medications will improve  I certify that inpatient services furnished can reasonably be expected to improve the patient's condition.     Karsten Ro, MD 7/3/20215:05 PM

## 2019-10-05 NOTE — ED Provider Notes (Signed)
Emergency Medicine Observation Re-evaluation Note  Claudia Alvizo is a 29 y.o. male, seen on rounds today.  Pt initially presented to the ED for complaints of Alcohol Problem Currently, the patient is awaiting psych reevaluation.  Physical Exam  BP (!) 144/104   Pulse 92   Temp 98 F (36.7 C) (Oral)   Resp 18   Ht 1.727 m (5\' 8" )   Wt 72.6 kg   SpO2 100%   BMI 24.33 kg/m  Physical Exam  ED Course / MDM  EKG:  Clinical Course as of Oct 04 1004  Fri Oct 04, 2019  2102 Normal  Rapid urine drug screen (hospital performed) [EW]  2102 Normal  cbc [EW]  2102 Abnormal, high  Ethanol(!!) [EW]  2102 Normal except protein high, albumin high, AST high, ALT high,anion gap elevated  Comprehensive metabolic panel(!) [EW]  2103 At this point the patient is medically cleared for treatment by psychiatry.   [EW]    Clinical Course User Index [EW] 2104, MD   I have reviewed the labs performed to date as well as medications administered while in observation.  Recent changes in the last 24 hours include nothing . Plan  Current plan is for psychiatric decision for disposition. Patient is not under full IVC at this time.   Mancel Bale, MD 10/05/19 1009

## 2019-10-05 NOTE — ED Notes (Signed)
Patient's mother visiting with patient. Patient's mother unhappy that patient is still in the hall, RN explained that with certain complaints we have to have patients in the hall for visibility purposes.  Patient's mother requested to go get him food, Water engineer spoke to patient's mom explaining the [facility] policy is no food from outside the hospital.

## 2019-10-06 LAB — TSH: TSH: 1.915 u[IU]/mL (ref 0.350–4.500)

## 2019-10-06 MED ORDER — ENSURE ENLIVE PO LIQD
237.0000 mL | ORAL | Status: DC
  Administered 2019-10-07 – 2019-10-08 (×2): 237 mL via ORAL

## 2019-10-06 MED ORDER — ADULT MULTIVITAMIN W/MINERALS CH
1.0000 | ORAL_TABLET | Freq: Every day | ORAL | Status: DC
  Administered 2019-10-06 – 2019-10-09 (×4): 1 via ORAL
  Filled 2019-10-06 (×6): qty 1

## 2019-10-06 NOTE — Progress Notes (Signed)
Pt denies SI/HI. He rates depression 4/10 and anxiety 4/10. Pt reports withdrawal symptoms of tremors, sweats, chills, nausea and cravings. He requested ativan for withdrawal symptoms. Ativan was administered for withdrawal symptoms.   Orders reviewed with pt. Verbal support provided. Pt encouraged to attend groups. 15 minute checks performed for safety.   Pt receptive to tx plan. Pt denies having any medications side effects.
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
## 2019-10-06 NOTE — Progress Notes (Signed)
   10/06/19 2236  COVID-19 Daily Checkoff  Have you had a fever (temp > 37.80C/100F)  in the past 24 hours?  No  If you have had runny nose, nasal congestion, sneezing in the past 24 hours, has it worsened? No  COVID-19 EXPOSURE  Have you traveled outside the state in the past 14 days? No  Have you been in contact with someone with a confirmed diagnosis of COVID-19 or PUI in the past 14 days without wearing appropriate PPE? No  Have you been living in the same home as a person with confirmed diagnosis of COVID-19 or a PUI (household contact)? No  Have you been diagnosed with COVID-19? No

## 2019-10-06 NOTE — Progress Notes (Signed)
NUTRITION ASSESSMENT RD working remotely.   Pt identified as at risk on the Malnutrition Screen Tool  INTERVENTION: - continue Ensure Enlive but will decrease from BID to once/day, each supplement provides 350 kcal and 20 grams of protein.  NUTRITION DIAGNOSIS: Unintentional weight loss related to sub-optimal intake as evidenced by pt report.   Goal: Pt to meet >/= 90% of their estimated nutrition needs.  Monitor:  PO intake  Assessment:  Patient admitted for severe MDD without psychosis, severe alcohol abuse, and SI. Patient reported to staff that he has been drinking alcohol since he was 29 years old.   Ensure Enlive was ordered BID starting this AM and patient accepted bottle offered to him so far.  Per chart review, weight yesterday was 160 lb and weight on 4/9 was 162 lb.   29 y.o. male  Height: Ht Readings from Last 1 Encounters:  10/05/19 5\' 8"  (1.727 m)    Weight: Wt Readings from Last 1 Encounters:  10/05/19 72.6 kg    Weight Hx: Wt Readings from Last 10 Encounters:  10/05/19 72.6 kg  10/04/19 72.6 kg  07/12/19 73.9 kg  12/04/18 77.6 kg  09/25/18 80.3 kg  06/10/18 76.7 kg  06/06/18 76.7 kg  06/06/18 76.9 kg  05/16/18 74.8 kg  05/20/16 74.8 kg    BMI:  Body mass index is 24.33 kg/m. Pt meets criteria for normal weight based on current BMI.  Estimated Nutritional Needs: Kcal: 25-30 kcal/kg Protein: > 1 gram protein/kg Fluid: 1 ml/kcal  Diet Order:  Diet Order            Diet regular Room service appropriate? Yes; Fluid consistency: Thin  Diet effective now                Pt is also offered choice of unit snacks mid-morning and mid-afternoon.  Pt is eating as desired.   Lab results and medications reviewed.      05/22/16, MS, RD, LDN, CNSC Inpatient Clinical Dietitian RD pager # available in [facility]  After hours/weekend pager # available in [facility]

## 2019-10-06 NOTE — Progress Notes (Signed)
   10/06/19 2238  Psych Admission Type (Psych Patients Only)  Admission Status Voluntary  Psychosocial Assessment  Patient Complaints Substance abuse  Eye Contact Fair  Facial Expression Flat  Affect Appropriate to circumstance  Speech Logical/coherent  Interaction Assertive  Motor Activity Fidgety  Appearance/Hygiene Unremarkable  Behavior Characteristics Appropriate to situation  Mood Anxious;Pleasant  Thought Process  Coherency WDL  Content WDL  Delusions None reported or observed  Perception WDL  Hallucination None reported or observed  Judgment Impaired  Confusion None  Danger to Self  Current suicidal ideation? Denies  Danger to Others  Danger to Others None reported or observed

## 2019-10-06 NOTE — Progress Notes (Signed)
[facility] MD Progress Note  10/06/2019 2:26 PM Victor [name]  MRN:  [id number]  Subjective: Victor [name] reports, "I'm doing better, but I still have the shakes, cold sweats & some anxiety. I slept fairly well last night. I'm doing well on the medicines".  Objective: Patient is a 29 year old male who originally presented to the behavioral health center assessment services on 10/04/2019 requesting placement in an alcohol rehabilitation facility.  He was noted to be intoxicated at the time of evaluation, and the patient was sent to the [facility] emergency department for medical clearance.  Apparently he did not go to the hospital but returned to the observation unit again intoxicated.  He was then sent to the [facility] emergency room.  In the emergency room his blood alcohol was found to be 332.  His AST and ALT were both elevated.  There is a note in the chart that stated that he had "vague suicidal ideation". Victor [name] is seen, chart reviewed. The chart findings discussed with the treatment team. He is lying down in his bed. He is verbally responsive, making good eye contact. He says he is doing better than when he got to the hospital. However, he continues to complain of tremors, cold sweats & some anxiety symptoms. He does not present distressed or uncomfortable. He says he is doing well on his medications, denies any side effects. He did report sleeping fairly last night. He is yet to attend group sessions. He is encouraged to come out of his room to attend the group sessions. He denies any SIHI, AVH, delusional thoughts or paranoia. He does not appear to be responding to any internal stimuli. He is in agreement to continue current plan of care as already in progress.  Principal Problem: Major depressive disorder, single episode, severe without psychotic features (HCC)  Diagnosis: Principal Problem:   Major depressive disorder, single episode, severe without psychotic features (HCC) Active  Problems:   Alcohol use disorder, severe, dependence (HCC)   Major depressive disorder, single episode, severe without psychosis (HCC)  Total Time spent with patient: 25 minutes  Past Psychiatric History: See H&P  Past Medical History:  Past Medical History:  Diagnosis Date   Acid reflux    ADHD (attention deficit hyperactivity disorder)    Anxiety    Hiatal hernia    Panic attack     Past Surgical History:  Procedure Laterality Date   LAPAROSCOPIC APPENDECTOMY N/A 08/03/2015   Procedure: APPENDECTOMY LAPAROSCOPIC;  Surgeon: Manus Rudd, MD;  Location: [facility] OR;  Service: General;  Laterality: N/A;   Family History:  Family History  Problem Relation Age of Onset   Colon cancer Neg Hx    Esophageal cancer Neg Hx    Rectal cancer Neg Hx    Stomach cancer Neg Hx    Family Psychiatric  History: See H&P  Social History:  Social History   Substance and Sexual Activity  Alcohol Use Yes   Alcohol/week: 13.0 - 20.0 standard drinks   Types: 12 - 18 Cans of beer, 1 - 2 Glasses of wine per week   Comment: daily     Social History   Substance and Sexual Activity  Drug Use Not Currently   Types: Cocaine   Comment: occ    Social History   Socioeconomic History   Marital status: Single    Spouse name: n/a   Number of children: 0   Years of education: Associates   Highest education level: Not on file  Occupational History  Occupation: Chief Executive Officer  Tobacco Use   Smoking status: Current Every Day Smoker    Packs/day: 0.50    Types: Cigarettes    Last attempt to quit: 04/01/2015    Years since quitting: 4.5   Smokeless tobacco: Never Used  Vaping Use   Vaping Use: Former  Substance and Sexual Activity   Alcohol use: Yes    Alcohol/week: 13.0 - 20.0 standard drinks    Types: 12 - 18 Cans of beer, 1 - 2 Glasses of wine per week    Comment: daily   Drug use: Not Currently    Types: Cocaine    Comment: occ   Sexual activity:  Not on file  Other Topics Concern   Not on file  Social History Narrative   Lives with his girlfriend currently    Right handed   Caffeine: about 1 can soda/day   Social Determinants of Health   Financial Resource Strain:    Difficulty of Paying Living Expenses:   Food Insecurity:    Worried About Programme researcher, broadcasting/film/video in the Last Year:    Barista in the Last Year:   Transportation Needs:    Freight forwarder (Medical):    Lack of Transportation (Non-Medical):   Physical Activity:    Days of Exercise per Week:    Minutes of Exercise per Session:   Stress:    Feeling of Stress :   Social Connections:    Frequency of Communication with Friends and Family:    Frequency of Social Gatherings with Friends and Family:    Attends Religious Services:    Active Member of Clubs or Organizations:    Attends Banker Meetings:    Marital Status:    Additional Social History:   Sleep: Good  Appetite:  Good  Current Medications: Current Facility-Administered Medications  Medication Dose Route Frequency Provider Last Rate Last Admin   acetaminophen (TYLENOL) tablet 650 mg  650 mg Oral Q6H PRN Antonieta Pert, MD       alum & mag hydroxide-simeth (MAALOX/MYLANTA) 200-200-20 MG/5ML suspension 30 mL  30 mL Oral Q4H PRN Antonieta Pert, MD       [START ON 10/07/2019] feeding supplement (ENSURE ENLIVE) (ENSURE ENLIVE) liquid 237 mL  237 mL Oral Q24H Antonieta Pert, MD       folic acid (FOLVITE) tablet 1 mg  1 mg Oral Daily Leone Haven, Traci Sermon, MD   1 mg at 10/06/19 6160   hydrOXYzine (ATARAX/VISTARIL) tablet 25 mg  25 mg Oral Q6H PRN Charm Rings, NP       loperamide (IMODIUM) capsule 2-4 mg  2-4 mg Oral PRN Charm Rings, NP       LORazepam (ATIVAN) tablet 1 mg  1 mg Oral Q6H PRN Charm Rings, NP   1 mg at 10/06/19 0810   magnesium hydroxide (MILK OF MAGNESIA) suspension 30 mL  30 mL Oral Daily PRN Charm Rings, NP        multivitamin with minerals tablet 1 tablet  1 tablet Oral Daily Antonieta Pert, MD   1 tablet at 10/06/19 1336   nicotine polacrilex (NICORETTE) gum 2 mg  2 mg Oral PRN Antonieta Pert, MD   2 mg at 10/06/19 1336   ondansetron (ZOFRAN-ODT) disintegrating tablet 4 mg  4 mg Oral Q6H PRN Charm Rings, NP       sertraline (ZOLOFT) tablet 100 mg  100 mg Oral Daily  Karsten Ro, MD   100 mg at 10/06/19 3329   thiamine tablet 100 mg  100 mg Oral Daily Karsten Ro, MD   100 mg at 10/06/19 5188   traZODone (DESYREL) tablet 50 mg  50 mg Oral QHS PRN Antonieta Pert, MD       Lab Results:  Results for orders placed or performed during the hospital encounter of 10/05/19 (from the past 48 hour(s))  TSH     Status: None   Collection Time: 10/06/19  6:41 AM  Result Value Ref Range   TSH 1.915 0.350 - 4.500 uIU/mL    Comment: Performed by a 3rd Generation assay with a functional sensitivity of <=0.01 uIU/mL. Performed at [facility], [address], [place], [place] [address]    Blood Alcohol level:  Lab Results  Component Value Date   ETH [facility]) 10/04/2019   Metabolic Disorder Labs: No results found for: HGBA1C, MPG No results found for: PROLACTIN No results found for: CHOL, TRIG, HDL, CHOLHDL, VLDL, LDLCALC  Physical Findings: AIMS: Facial and Oral Movements Muscles of Facial Expression: None, normal Lips and Perioral Area: None, normal Jaw: None, normal Tongue: None, normal,Extremity Movements Upper (arms, wrists, hands, fingers): None, normal Lower (legs, knees, ankles, toes): None, normal, Trunk Movements Neck, shoulders, hips: None, normal, Overall Severity Severity of abnormal movements (highest score from questions above): None, normal Incapacitation due to abnormal movements: None, normal Patient's awareness of abnormal movements (rate only patient's report): No Awareness, Dental Status Current problems with teeth and/or dentures?: No Does  patient usually wear dentures?: No  CIWA:  CIWA-Ar Total: 1 COWS:     Musculoskeletal: Strength & Muscle Tone: within normal limits Gait & Station: normal Patient leans: N/A  Psychiatric Specialty Exam: Physical Exam Vitals and nursing note reviewed.  HENT:     Nose: Nose normal.     Mouth/Throat:     Pharynx: Oropharynx is clear.  Eyes:     Pupils: Pupils are equal, round, and reactive to light.  Cardiovascular:     Rate and Rhythm: Normal rate.  Pulmonary:     Effort: Pulmonary effort is normal.  Genitourinary:    Comments: Deferred Musculoskeletal:        General: Normal range of motion.     Cervical back: Normal range of motion.  Skin:    General: Skin is warm and dry.  Neurological:     Mental Status: He is alert and oriented to person, place, and time.     Review of Systems  Constitutional: Negative for chills, diaphoresis and fever.  HENT: Negative for congestion, rhinorrhea, sneezing and sore throat.   Eyes: Negative for discharge.  Respiratory: Negative for cough, chest tightness, shortness of breath and wheezing.   Cardiovascular: Negative for chest pain and palpitations.  Gastrointestinal: Negative for diarrhea, nausea and vomiting.  Endocrine: Negative for cold intolerance.  Genitourinary: Negative for difficulty urinating.  Musculoskeletal: Negative for arthralgias and myalgias.  Allergic/Immunologic:       Allergies: NKDA  Neurological: Negative for dizziness, seizures, syncope, light-headedness and headaches.  Psychiatric/Behavioral: Positive for dysphoric mood ("Improving"). Negative for agitation, behavioral problems, confusion, decreased concentration, hallucinations, self-injury, sleep disturbance and suicidal ideas. The patient is nervous/anxious. The patient is not hyperactive.     Blood pressure (!) 124/99, pulse (!) 102, temperature 98.3 F (36.8 C), temperature source Oral, resp. rate 16, height 5\' 8"  (1.727 m), weight 72.6 kg, SpO2 99 %.Body  mass index is 24.33 kg/m.  General Appearance:  Disheveled  Eye Contact:  Fair  Speech:  Normal Rate  Volume:  Normal  Mood:  Anxious and Depressed  Affect:  Congruent  Thought Process:  Coherent and Descriptions of Associations: Intact  Orientation:  Full (Time, Place, and Person)  Thought Content:  Logica  Suicidal Thoughts:  No  Homicidal Thoughts:  No  Memory:  Immediate;   Fair Recent;   Fair Remote;   Fair  Judgement:  Intact  Insight:  Fair  Psychomotor Activity:  Normal  Concentration:  Concentration: Fair and Attention Span: Fair  Recall:  FiservFair  Fund of Knowledge:  Fair  Language:  Fair  Akathisia:  Negative  Handed:  Right  AIMS (if indicated):     Assets:  Desire for Improvement Resilience  ADL's:  Intact  Cognition:  WNL    Sleep:  Number of Hours: 6.75   Treatment Plan Summary: Daily contact with patient to assess and evaluate symptoms and progress in treatment and Medication management.  - Continue inpatient hospitalization. - Will continue today 10/06/2019 plan as below except where it is noted.  Alcohol withdrawal.    - Continue Ativan 1 mg po Q 6 hrs prn for CIWA > 10.    - Continue Vistaril 25 mg po Q 6 hrs prn for CIWA > 10.  Depression.    - Continue Sertraline 100 mg po daily for depression.  Insomnia.    - Continue Trazodone 50 mg po Q hs prn.  Continue Folic acid 1 mg po daily for folate replacement. Continue Thiamine 100 mg po Q daily for low thiamine replacement. Continue Zofran-ODT 4 mg po Q 6 hrs prn for N/V. Continue Imodium 2-4 mg po prn for diarrhea. Continue Nicorette gum 2 mg po prn for nicotine withdrawal symptoms.  Encourage group participation. Discharge disposition in progress.  Armandina StammerAgnes Traeton Bordas, NP, PMHNP, FNP-BC. 10/06/2019, 2:26 PM

## 2019-10-07 MED ORDER — PROPRANOLOL HCL 10 MG PO TABS
10.0000 mg | ORAL_TABLET | Freq: Two times a day (BID) | ORAL | Status: DC
  Administered 2019-10-07 – 2019-10-09 (×4): 10 mg via ORAL
  Filled 2019-10-07 (×7): qty 1

## 2019-10-07 MED ORDER — NICOTINE 21 MG/24HR TD PT24
21.0000 mg | MEDICATED_PATCH | Freq: Every day | TRANSDERMAL | Status: DC
  Administered 2019-10-07 – 2019-10-09 (×3): 21 mg via TRANSDERMAL
  Filled 2019-10-07 (×3): qty 1

## 2019-10-07 NOTE — Progress Notes (Signed)
Pt did attend the evening wrap up group. Positive thinking and positive change were discussed. Pt was attentive, supportive, and engaged.  

## 2019-10-07 NOTE — Progress Notes (Signed)
   10/07/19 2200  Psych Admission Type (Psych Patients Only)  Admission Status Voluntary  Psychosocial Assessment  Patient Complaints Anxiety;Depression  Eye Contact Fair  Facial Expression Flat  Affect Appropriate to circumstance  Speech Logical/coherent  Interaction Assertive  Motor Activity Fidgety  Appearance/Hygiene Unremarkable  Behavior Characteristics Anxious  Mood Depressed  Thought Process  Coherency WDL  Content WDL  Delusions None reported or observed  Perception WDL  Hallucination None reported or observed  Judgment Impaired  Confusion None  Danger to Self  Current suicidal ideation? Denies  Danger to Others  Danger to Others None reported or observed

## 2019-10-07 NOTE — Progress Notes (Signed)
Recreation Therapy Notes  Date:  7.5.21 Time: 0930 Location: [address] Dayroom  Group Topic: Stress Management  Goal Area(s) Addresses:  Patient will identify positive stress management techniques. Patient will identify benefits of using stress management post d/c.  Intervention: Stress Management  Activity: Meditation.  LRT played a meditation that focused on being resilient in the face of change.  Patients were to listen and follow along as meditation played in order to engage in activity.    Education:  Stress Management, Discharge Planning.   Education Outcome: Acknowledges Education  Clinical Observations/Feedback: Pt did not attend group activity.    Caroll Rancher, LRT/CTRS         Lillia Abed, Miller Limehouse A 10/07/2019 11:13 AM

## 2019-10-07 NOTE — Tx Team (Signed)
Interdisciplinary Treatment and Diagnostic Plan Update  10/07/2019 Time of Session: 10:00am Victor [name] MRN: [id number]  Principal Diagnosis: Major depressive disorder, single episode, severe without psychotic features ([place])  Secondary Diagnoses: Principal Problem:   Major depressive disorder, single episode, severe without psychotic features ([place]) Active Problems:   Alcohol use disorder, severe, dependence ([place])   Major depressive disorder, single episode, severe without psychosis ([place])   Current Medications:  Current Facility-Administered Medications  Medication Dose Route Frequency Provider Last Rate Last Admin  . acetaminophen (TYLENOL) tablet 650 mg  650 mg Oral Q6H PRN [name], MD      . alum & mag hydroxide-simeth (MAALOX/MYLANTA) 200-200-20 MG/5ML suspension 30 mL  30 mL Oral Q4H PRN [name], MD      . feeding supplement (ENSURE ENLIVE) (ENSURE ENLIVE) liquid 237 mL  237 mL Oral Q24H [name], MD   237 mL at 10/07/19 0928  . folic acid (FOLVITE) tablet 1 mg  1 mg Oral Daily [name], [name], MD   1 mg at 10/07/19 0900  . hydrOXYzine (ATARAX/VISTARIL) tablet 25 mg  25 mg Oral Q6H PRN [name], NP      . loperamide (IMODIUM) capsule 2-4 mg  2-4 mg Oral PRN [name], NP      . LORazepam (ATIVAN) tablet 1 mg  1 mg Oral Q6H PRN [name], NP   1 mg at 10/06/19 2112  . magnesium hydroxide (MILK OF MAGNESIA) suspension 30 mL  30 mL Oral Daily PRN [name], NP      . multivitamin with minerals tablet 1 tablet  1 tablet Oral Daily [name], MD   1 tablet at 10/07/19 0900  . nicotine polacrilex (NICORETTE) gum 2 mg  2 mg Oral PRN [name], MD   2 mg at 10/07/19 0929  . ondansetron (ZOFRAN-ODT) disintegrating tablet 4 mg  4 mg Oral Q6H PRN [name], NP      . sertraline (ZOLOFT) tablet 100 mg  100 mg Oral Daily [name], [name], MD   100 mg at 10/07/19 0900  . thiamine tablet 100 mg  100 mg Oral Daily [name],  [name], MD   100 mg at 10/07/19 0900  . traZODone (DESYREL) tablet 50 mg  50 mg Oral QHS PRN [name], MD   50 mg at 10/06/19 2112   PTA Medications: Medications Prior to Admission  Medication Sig Dispense Refill Last Dose  . ALPRAZolam (XANAX) 0.5 MG tablet TAKE ONE-HALF-1 TABLET EVERY DAY AS NEEDED FOR PANIC ATTACKS 30 tablet 2   . amphetamine-dextroamphetamine (ADDERALL XR) 25 MG 24 hr capsule Take 1 capsule by mouth every morning. 30 capsule 0   . amphetamine-dextroamphetamine (ADDERALL) 10 MG tablet Take 1 tablet (10 mg total) by mouth daily as needed. 30 tablet 0   . ibuprofen (ADVIL) 800 MG tablet Take 1 tablet (800 mg total) by mouth every 8 (eight) hours as needed for moderate pain. 21 tablet 0   . methocarbamol (ROBAXIN) 500 MG tablet Take 1 tablet (500 mg total) by mouth 2 (two) times daily. 20 tablet 0   . omeprazole (PRILOSEC) 40 MG capsule TAKE 1 CAPSULE(40 MG) BY MOUTH TWICE DAILY 30 MINUTES BEFORE MEALS (Patient taking differently: Take 40 mg by mouth 2 (two) times daily. ) 60 capsule 3   . propranolol (INDERAL) 10 MG tablet Take 1 to 2 tablets by mouth twice daily as needed for anxiety 360 tablet 0   .  sertraline (ZOLOFT) 50 MG tablet Take 1/2 tab po qd x 2-4 days, then 1 tab po qd x 1 week, then 2 tabs po qd (Patient taking differently: Take 100 mg by mouth daily. ) 60 tablet 1     Patient Stressors: Occupational concerns Substance abuse  Patient Strengths: Ability for insight Capable of independent living  Treatment Modalities: Medication Management, Group therapy, Case management,  1 to 1 session with clinician, Psychoeducation, Recreational therapy.   Physician Treatment Plan for Primary Diagnosis: Major depressive disorder, single episode, severe without psychotic features ([place]) Long Term Goal(s): Improvement in symptoms so as ready for discharge Improvement in symptoms so as ready for discharge   Short Term Goals: Ability to identify changes in  lifestyle to reduce recurrence of condition will improve Ability to verbalize feelings will improve Ability to disclose and discuss suicidal ideas Ability to demonstrate self-control will improve Ability to identify and develop effective coping behaviors will improve Ability to maintain clinical measurements within normal limits will improve Compliance with prescribed medications will improve Ability to verbalize feelings will improve Ability to disclose and discuss suicidal ideas Ability to demonstrate self-control will improve Ability to identify and develop effective coping behaviors will improve Ability to maintain clinical measurements within normal limits will improve Compliance with prescribed medications will improve  Medication Management: Evaluate patient's response, side effects, and tolerance of medication regimen.  Therapeutic Interventions: 1 to 1 sessions, Unit Group sessions and Medication administration.  Evaluation of Outcomes: Not Met  Physician Treatment Plan for Secondary Diagnosis: Principal Problem:   Major depressive disorder, single episode, severe without psychotic features ([place]) Active Problems:   Alcohol use disorder, severe, dependence ([place])   Major depressive disorder, single episode, severe without psychosis ([place])  Long Term Goal(s): Improvement in symptoms so as ready for discharge Improvement in symptoms so as ready for discharge   Short Term Goals: Ability to identify changes in lifestyle to reduce recurrence of condition will improve Ability to verbalize feelings will improve Ability to disclose and discuss suicidal ideas Ability to demonstrate self-control will improve Ability to identify and develop effective coping behaviors will improve Ability to maintain clinical measurements within normal limits will improve Compliance with prescribed medications will improve Ability to verbalize feelings will improve Ability to disclose and discuss  suicidal ideas Ability to demonstrate self-control will improve Ability to identify and develop effective coping behaviors will improve Ability to maintain clinical measurements within normal limits will improve Compliance with prescribed medications will improve     Medication Management: Evaluate patient's response, side effects, and tolerance of medication regimen.  Therapeutic Interventions: 1 to 1 sessions, Unit Group sessions and Medication administration.  Evaluation of Outcomes: Not Met   RN Treatment Plan for Primary Diagnosis: Major depressive disorder, single episode, severe without psychotic features ([place]) Long Term Goal(s): Knowledge of disease and therapeutic regimen to maintain health will improve  Short Term Goals: Ability to participate in decision making will improve, Ability to verbalize feelings will improve, Ability to disclose and discuss suicidal ideas, Ability to identify and develop effective coping behaviors will improve and Compliance with prescribed medications will improve  Medication Management: RN will administer medications as ordered by provider, will assess and evaluate patient's response and provide education to patient for prescribed medication. RN will report any adverse and/or side effects to prescribing provider.  Therapeutic Interventions: 1 on 1 counseling sessions, Psychoeducation, Medication administration, Evaluate responses to treatment, Monitor vital signs and CBGs as ordered, Perform/monitor CIWA, COWS, AIMS and Fall  Risk screenings as ordered, Perform wound care treatments as ordered.  Evaluation of Outcomes: Not Met   LCSW Treatment Plan for Primary Diagnosis: Major depressive disorder, single episode, severe without psychotic features ([place]) Long Term Goal(s): Safe transition to appropriate next level of care at discharge, Engage patient in therapeutic group addressing interpersonal concerns.  Short Term Goals: Engage patient in aftercare  planning with referrals and resources  Therapeutic Interventions: Assess for all discharge needs, 1 to 1 time with Social worker, Explore available resources and support systems, Assess for adequacy in community support network, Educate family and significant other(s) on suicide prevention, Complete Psychosocial Assessment, Interpersonal group therapy.  Evaluation of Outcomes: Not Met   Progress in Treatment: Attending groups: No. Participating in groups: No. Taking medication as prescribed: Yes. Toleration medication: Yes. Family/Significant other contact made: No, will contact:  when pt gives consent Patient understands diagnosis: Yes. Discussing patient identified problems/goals with staff: Yes. Medical problems stabilized or resolved: Yes. Denies suicidal/homicidal ideation: Yes. Issues/concerns per patient self-inventory: No. Other: NA  New problem(s) identified: No, Describe:  None reported  New Short Term/Long Term Goal(s):Attend outpatient treatment, develop and implement healthy coping methods and take medication as prescribed  Patient Goals:  "Detox and move to a rehab program"  Discharge Plan or Barriers: Pt request referral for substance use inpatient residential treatment.  Reason for Continuation of Hospitalization: Depression Medication stabilization Suicidal ideation Withdrawal symptoms  Estimated Length of Stay:1-7 days  Attendees: Patient:Victor [name] 10/07/2019 10:50 AM  Physician:  10/07/2019 10:50 AM  Nursing: Harriett Sine 10/07/2019 10:50 AM  RN Care Manager: 10/07/2019 10:50 AM  Social Worker: Sanjuana Kava 10/07/2019 10:50 AM  Recreational Therapist:  10/07/2019 10:50 AM  Other:  10/07/2019 10:50 AM  Other:  10/07/2019 10:50 AM  Other: 10/07/2019 10:50 AM    Scribe for Treatment Team: Yvette Rack, LCSW 10/07/2019 10:50 AM

## 2019-10-07 NOTE — [facility] Suicide Risk Assessment (Signed)
[facility] INPATIENT:  Family/Significant Other Suicide Prevention Education  Suicide Prevention Education:  Education Completed; Yostin Malacara, mother 64 has been identified by the patient as the family member/significant other with whom the patient will be residing, and identified as the person(s) who will aid the patient in the event of a mental health crisis (suicidal ideations/suicide attempt).  With written consent from the patient, the family member/significant other has been provided the following suicide prevention education, prior to the and/or following the discharge of the patient.  The suicide prevention education provided includes the following:  Suicide risk factors  Suicide prevention and interventions  National Suicide Hotline telephone number  [facility] assessment telephone number  [facility] Emergency Assistance 911  [facility] and/or Residential Mobile Crisis Unit telephone number  Request made of family/significant other to:  Remove weapons (e.g., guns, rifles, knives), all items previously/currently identified as safety concern.    Remove drugs/medications (over-the-counter, prescriptions, illicit drugs), all items previously/currently identified as a safety concern.  The family member/significant other verbalizes understanding of the suicide prevention education information provided.  The family member/significant other agrees to remove the items of safety concern listed above. Ms. Klippel reports for the past couple of years she has noticed pt alcohol consumption has increased. She states the pt drinks 10-13 beers per day and reports this may be exacerbated by his dog passing away two weeks ago and the loss of his grandparents. She reports he had a close relationship with his dog and has used alcohol to self medicate. Ms. Rabideau reports the pt has family history of alcoholism, stating the pt has two brothers who are battling alcoholism. Ms. Gill  states the pt lives in the home with her and his brother. She denies the pt having access to guns or weapons and states the pt's friend removed the weapons from the home. Ms. Dibello reports the pt having a strong support system.  Calyn Sivils T Retta Pitcher 10/07/2019, 3:29 PM

## 2019-10-07 NOTE — Plan of Care (Signed)
Nurse discussed anxiety, depression and coping skills with patient.  

## 2019-10-07 NOTE — Progress Notes (Signed)
D:  Patient's self inventory sheet, patient sleeps good, no sleep medication.  Good appetite, normal energy level, good concentration.  Rated depression and hopeless 2, anxiety 4.  Withdrawals, tremors, chilling.  Denied SI.  Denied physical pain.  Goal is discharge, detox.  Wants better sleep med tonight. A:  Medications administered per MD orders.  Emotional support and encouragement given patient. R:  Denied SI and HI, contracts for safety.  Denied A/V hallucinations. Safety maintained with 15 minute checks.

## 2019-10-07 NOTE — [facility] Group Notes (Signed)
Adult Psychoeducational Group Note  Date:  10/07/2019 Time:  9:35 PM  Group Topic/Focus:  Wrap-Up Group:   The focus of this group is to help patients review their daily goal of treatment and discuss progress on daily workbooks.  Participation Level:  Active  Participation Quality:  Appropriate and Attentive  Affect:  Appropriate  Cognitive:  Alert and Appropriate  Insight: Appropriate and Good  Engagement in Group:  Engaged  Modes of Intervention:  Discussion and Education  Additional Comments:  Pt attended and participated in wrap up group this evening and rated their day a 5/10, due to them not doing anything exciting and not being able to go outside. Pt did not complete their goal, which was to speak with the SW about discharge plans.   Victor [name] 10/07/2019, 9:35 PM

## 2019-10-07 NOTE — Progress Notes (Signed)
[facility]  10/07/2019 1:34 PM Victor [name] [place]  MRN:  [id number] Subjective:  Pt is a 29 year old male with past history of ADHD, Alcohol Abuse Disorder, Depression, Social Anxiety who [facility]. At evaluation he was too confused and drunk and had vague thoughts of suicidal. Pt was sent to ED for evaluation and [facility] symptoms and Detox.No previous suicidal attempts.In the ED, CBC, LFT and Comprehensive panel was done. CBC, LFT's were normal. Glucose was 99, Anion gap was 16 and Urine toxicoloy was negative. His BAL on admission was 332. Pt was seen and examined today. Pt states he feels good today. Pt states his mood is better today and he feels less anxious. Pt states he has cravings for cigarettes and nicotine gum is not helping. Pt states he had cold sweats yesterday with nervousness but feels ok today. Pt states his tremors has improved since admission. Pt states he sees hope for future and looking forward to a rehab facility. Pt states his sister is also helping to find a place for rehab. Pt denies any suicidal and homicidal ideation. Pt denies episodes of high energy, increased grandiosity and, visual, auditory and tactile hallucinations. Pt states his energy and appetite are good. No other complaints. Pt slept well last night for about 6 hrs.   Principal Problem: Major depressive disorder, single episode, severe without psychotic features ([facility]) Diagnosis: Principal Problem:   Major depressive disorder, single episode, severe without psychotic features ([facility]) Active Problems:   Alcohol use disorder, severe, dependence ([facility])   Major depressive disorder, single episode, severe without psychosis ([facility])  Total Time spent with patient: 20 minutes  Past Psychiatric History: Social Anxiety, ADHD  Past Medical History:  Past Medical History:  Diagnosis Date   Acid reflux    ADHD  (attention deficit hyperactivity disorder)    Anxiety    Hiatal hernia    Panic attack     Past Surgical History:  Procedure Laterality Date   LAPAROSCOPIC APPENDECTOMY N/A 08/03/2015   Procedure: APPENDECTOMY LAPAROSCOPIC;  Surgeon: Manus RuddMatthew Tsuei, MD;  Location: [facility] OR;  Service: General;  Laterality: N/A;   Family History:  Family History  Problem Relation Age of Onset   Colon cancer Neg Hx    Esophageal cancer Neg Hx    Rectal cancer Neg Hx    Stomach cancer Neg Hx    Family Psychiatric  History: None Social History:  Social History   Substance and Sexual Activity  Alcohol Use Yes   Alcohol/week: 13.0 - 20.0 standard drinks   Types: 12 - 18 Cans of beer, 1 - 2 Glasses of wine per week   Comment: daily     Social History   Substance and Sexual Activity  Drug Use Not Currently   Types: Cocaine   Comment: occ    Social History   Socioeconomic History   Marital status: Single    Spouse name: n/a   Number of children: 0   Years of education: Associates   Highest education level: Not on file  Occupational History   Occupation: Chief Executive OfficerHidden Dog Fence Installation  Tobacco Use   Smoking status: Current Every Day Smoker    Packs/day: 0.50    Types: Cigarettes    Last attempt to quit: 04/01/2015    Years since quitting: 4.5   Smokeless tobacco: Never Used  Vaping Use   Vaping Use: Former  Substance  and Sexual Activity   Alcohol use: Yes    Alcohol/week: 13.0 - 20.0 standard drinks    Types: 12 - 18 Cans of beer, 1 - 2 Glasses of wine per week    Comment: daily   Drug use: Not Currently    Types: Cocaine    Comment: occ   Sexual activity: Not on file  Other Topics Concern   Not on file  Social History Narrative   Lives with his girlfriend currently    Right handed   Caffeine: about 1 can soda/day   Social Determinants of Health   Financial Resource Strain:    Difficulty of Paying Living Expenses:   Food Insecurity:    Worried About  Programme researcher, broadcasting/film/video in the Last Year:    Barista in the Last Year:   Transportation Needs:    Freight forwarder (Medical):    Lack of Transportation (Non-Medical):   Physical Activity:    Days of Exercise per Week:    Minutes of Exercise per Session:   Stress:    Feeling of Stress :   Social Connections:    Frequency of Communication with Friends and Family:    Frequency of Social Gatherings with Friends and Family:    Attends Religious Services:    Active Member of Clubs or Organizations:    Attends Banker Meetings:    Marital Status:    Additional Social History:                         Sleep: Good  Appetite:  Good  Current Medications: Current Facility-Administered Medications  Medication Dose Route Frequency Provider Last Rate Last Admin   acetaminophen (TYLENOL) tablet 650 mg  650 mg Oral Q6H PRN Antonieta Pert, MD       alum & mag hydroxide-simeth (MAALOX/MYLANTA) 200-200-20 MG/5ML suspension 30 mL  30 mL Oral Q4H PRN Antonieta Pert, MD       feeding supplement (ENSURE ENLIVE) (ENSURE ENLIVE) liquid 237 mL  237 mL Oral Q24H Antonieta Pert, MD   237 mL at 10/07/19 5170   folic acid (FOLVITE) tablet 1 mg  1 mg Oral Daily Bani Gianfrancesco, MD   1 mg at 10/07/19 0900   hydrOXYzine (ATARAX/VISTARIL) tablet 25 mg  25 mg Oral Q6H PRN Charm Rings, NP       loperamide (IMODIUM) capsule 2-4 mg  2-4 mg Oral PRN Charm Rings, NP       LORazepam (ATIVAN) tablet 1 mg  1 mg Oral Q6H PRN Charm Rings, NP   1 mg at 10/06/19 2112   magnesium hydroxide (MILK OF MAGNESIA) suspension 30 mL  30 mL Oral Daily PRN Charm Rings, NP       multivitamin with minerals tablet 1 tablet  1 tablet Oral Daily Antonieta Pert, MD   1 tablet at 10/07/19 0900   nicotine polacrilex (NICORETTE) gum 2 mg  2 mg Oral PRN Antonieta Pert, MD   2 mg at 10/07/19 1219   ondansetron (ZOFRAN-ODT) disintegrating tablet 4 mg  4 mg Oral  Q6H PRN Charm Rings, NP       propranolol (INDERAL) tablet 10 mg  10 mg Oral BID Antonieta Pert, MD   10 mg at 10/07/19 1218   sertraline (ZOLOFT) tablet 100 mg  100 mg Oral Daily Karsten Ro, MD   100 mg at 10/07/19 0900  thiamine tablet 100 mg  100 mg Oral Daily Tobyn Osgood, MD   100 mg at 10/07/19 0900   traZODone (DESYREL) tablet 50 mg  50 mg Oral QHS PRN Antonieta Pert, MD   50 mg at 10/06/19 2112    Lab Results:  Results for orders placed or performed during the hospital encounter of 10/05/19 (from the past 48 hour(s))  TSH     Status: None   Collection Time: 10/06/19  6:41 AM  Result Value Ref Range   TSH 1.915 0.350 - 4.500 uIU/mL    Comment: Performed by a 3rd Generation assay with a functional sensitivity of <=0.01 uIU/mL. Performed at [facility], [address] [place], [place] [address]     Blood Alcohol level:  Lab Results  Component Value Date   ETH [facility]) 10/04/2019    Metabolic Disorder Labs: No results found for: HGBA1C, MPG No results found for: PROLACTIN No results found for: CHOL, TRIG, HDL, CHOLHDL, VLDL, LDLCALC  Physical Findings: AIMS: Facial and Oral Movements Muscles of Facial Expression: None, normal Lips and Perioral Area: None, normal Jaw: None, normal Tongue: None, normal,Extremity Movements Upper (arms, wrists, hands, fingers): None, normal Lower (legs, knees, ankles, toes): None, normal, Trunk Movements Neck, shoulders, hips: None, normal, Overall Severity Severity of abnormal movements (highest score from questions above): None, normal Incapacitation due to abnormal movements: None, normal Patient's awareness of abnormal movements (rate only patient's report): No Awareness, Dental Status Current problems with teeth and/or dentures?: No Does patient usually wear dentures?: No  CIWA:  CIWA-Ar Total: 1 COWS:     Musculoskeletal: Strength & Muscle Tone: within normal limits Gait & Station:  normal Patient leans: N/A  Psychiatric Specialty Exam: Physical Exam  Review of Systems  Blood pressure (!) 136/93, pulse 82, temperature 98.3 F (36.8 C), temperature source Oral, resp. rate 16, height 5\' 8"  (1.727 m), weight 72.6 kg, SpO2 99 %.Body mass index is 24.33 kg/m.  General Appearance: Neat  Eye Contact:  Good  Speech:  Slow  Volume:  Decreased  Mood:  Dysphoric  Affect:  Congruent  Thought Process:  Coherent  Orientation:  Full (Time, Place, and Person)  Thought Content:  Negative  Suicidal Thoughts:  No  Homicidal Thoughts:  No  Memory:  Immediate;   Good  Judgement:  Good  Insight:  Fair  Psychomotor Activity:  Negative  Concentration:  Concentration: Good  Recall:  Good  Fund of Knowledge:  Good  Language:  Good  Akathisia:  No  Handed:  Right  AIMS (if indicated):     Assets:  Desire for Improvement  ADL's:  Intact  Cognition:  WNL  Sleep:  Number of Hours: 5.75     Treatment Plan Summary: Daily contact with patient to assess and evaluate symptoms and progress in treatment. Pt admitted with above mentioned psychiatric history.   Labs- CBC, LFT and Comprehensive panel was done. CBC, LFT's were normal. Glucose was 99, Anion gap was 16 and Urine toxicoloy was negative. His BAL on admission was 332.   Pt was seen and examined today. Pt states he feels good today. Pt states his mood is better today and he feels less anxious. Pt denies any suicidal and homicidal ideation. Pt denies episodes of high energy, increased grandiosity and, visual, auditory and tactile hallucinations. Pt states his energy and appetite are good. No other complaints. Pt slept well last night for about 6 hrs. Blood pressure was elevated in the morning 136/42mmHg. For  that Propanolol 10 mg was added.  Plan- - Detox protocol in place. - Watch for withdrawal symptoms- anxiety, tremors, sweating, nervousness, delirium tremens. -Monitor vitals. -Continue Nicotine patch. -Social worker will  help in finding rehab facilities.    Karsten Ro, MD 10/07/2019, 1:34 PM

## 2019-10-08 MED ORDER — HYDROXYZINE HCL 25 MG PO TABS
25.0000 mg | ORAL_TABLET | Freq: Three times a day (TID) | ORAL | Status: DC | PRN
  Administered 2019-10-08: 25 mg via ORAL

## 2019-10-08 NOTE — Progress Notes (Signed)
Patient expressed in group that he spent more time outside of his bedroom today. He watched television with his peers as well. His goal for tomorrow is to get discharged or to go outside.

## 2019-10-08 NOTE — Progress Notes (Signed)
[facility] MD Progress Note  10/08/2019 11:48 AM Victor [name]  MRN:  [id number] Subjective:  Pt is a 29 year old male with past history of ADHD, Alcohol Abuse Disorder, Depression, Social Anxiety who came walk in yesterday to [facility] for evaluation of his drinking problem. At evaluation he was too confused and drunk and had vague thoughts of suicidal. Pt was sent to ED for evaluation and was admitted today to [facility] for management of depression symptoms and Detox.No previous suicidal attempts.In the ED, CBC, LFT and Comprehensive panel was done. CBC, LFT's were normal. Glucose was 99, Anion gap was 16 and Urine toxicoloy was negative. His BAL on admission was 332. Pt was seen and examined today. Pt states he feels pretty good today.Pt states his mood is good now and he has no complaints. Pt states the nicotine patch is working better and he doesn't have any craving for cigarettes anymore. Pt states he doesn't have any withdrawal symptoms. Pt denies any sweating, tremors and nervousness. Pt is hopeful for the future and looking forward to a rehab facility like [facility]. Pt states he is little sad that his sister is coming with his kids and he is not sure if he will be able to see her family before moving to long term rehab facility. Social worker is also helping to find a place for rehab. Pt denies any suicidal and homicidal ideation. Pt denies episodes of high energy, increased grandiosity and, visual, auditory and tactile hallucinations. Pt states his energy and appetite are good. Pt states he had 7-8 hours of sleep last night and ate his meals. No other complaints. No new labs.   Principal Problem: Major depressive disorder, single episode, severe without psychotic features (HCC) Diagnosis: Principal Problem:   Major depressive disorder, single episode, severe without psychotic features (HCC) Active Problems:   Alcohol use disorder, severe, dependence (HCC)   Major depressive disorder, single episode, severe without  psychosis (HCC)  Total Time spent with patient: 20 minutes  Past Psychiatric History: ADHD, Anxiety  Past Medical History:  Past Medical History:  Diagnosis Date  . Acid reflux   . ADHD (attention deficit hyperactivity disorder)   . Anxiety   . Hiatal hernia   . Panic attack     Past Surgical History:  Procedure Laterality Date  . LAPAROSCOPIC APPENDECTOMY N/A 08/03/2015   Procedure: APPENDECTOMY LAPAROSCOPIC;  Surgeon: Manus Rudd, MD;  Location: [facility] OR;  Service: General;  Laterality: N/A;   Family History:  Family History  Problem Relation Age of Onset  . Colon cancer Neg Hx   . Esophageal cancer Neg Hx   . Rectal cancer Neg Hx   . Stomach cancer Neg Hx    Family Psychiatric  History: None Social History:  Social History   Substance and Sexual Activity  Alcohol Use Yes  . Alcohol/week: 13.0 - 20.0 standard drinks  . Types: 12 - 18 Cans of beer, 1 - 2 Glasses of wine per week   Comment: daily     Social History   Substance and Sexual Activity  Drug Use Not Currently  . Types: Cocaine   Comment: occ    Social History   Socioeconomic History  . Marital status: Single    Spouse name: n/a  . Number of children: 0  . Years of education: Associates  . Highest education level: Not on file  Occupational History  . Occupation: Chief Executive Officer  Tobacco Use  . Smoking status: Current Every Day Smoker  Packs/day: 0.50    Types: Cigarettes    Last attempt to quit: 04/01/2015    Years since quitting: 4.5  . Smokeless tobacco: Never Used  Vaping Use  . Vaping Use: Former  Substance and Sexual Activity  . Alcohol use: Yes    Alcohol/week: 13.0 - 20.0 standard drinks    Types: 12 - 18 Cans of beer, 1 - 2 Glasses of wine per week    Comment: daily  . Drug use: Not Currently    Types: Cocaine    Comment: occ  . Sexual activity: Not on file  Other Topics Concern  . Not on file  Social History Narrative   Lives with his girlfriend currently     Right handed   Caffeine: about 1 can soda/day   Social Determinants of Health   Financial Resource Strain:   . Difficulty of Paying Living Expenses:   Food Insecurity:   . Worried About Programme researcher, broadcasting/film/videounning Out of Food in the Last Year:   . Baristaan Out of Food in the Last Year:   Transportation Needs:   . Freight forwarderLack of Transportation (Medical):   Marland Kitchen. Lack of Transportation (Non-Medical):   Physical Activity:   . Days of Exercise per Week:   . Minutes of Exercise per Session:   Stress:   . Feeling of Stress :   Social Connections:   . Frequency of Communication with Friends and Family:   . Frequency of Social Gatherings with Friends and Family:   . Attends Religious Services:   . Active Member of Clubs or Organizations:   . Attends BankerClub or Organization Meetings:   Marland Kitchen. Marital Status:    Additional Social History:                         Sleep: Good  Appetite:  Good  Current Medications: Current Facility-Administered Medications  Medication Dose Route Frequency Provider Last Rate Last Admin  . acetaminophen (TYLENOL) tablet 650 mg  650 mg Oral Q6H PRN Antonieta Pertlary, Greg Lawson, MD      . alum & mag hydroxide-simeth (MAALOX/MYLANTA) 200-200-20 MG/5ML suspension 30 mL  30 mL Oral Q4H PRN Antonieta Pertlary, Greg Lawson, MD      . feeding supplement (ENSURE ENLIVE) (ENSURE ENLIVE) liquid 237 mL  237 mL Oral Q24H Antonieta Pertlary, Greg Lawson, MD   237 mL at 10/08/19 0956  . folic acid (FOLVITE) tablet 1 mg  1 mg Oral Daily Karsten Rooda, Brayla Pat, MD   1 mg at 10/08/19 0755  . hydrOXYzine (ATARAX/VISTARIL) tablet 25 mg  25 mg Oral Q6H PRN Charm RingsLord, Jamison Y, NP   25 mg at 10/07/19 1845  . loperamide (IMODIUM) capsule 2-4 mg  2-4 mg Oral PRN Charm RingsLord, Jamison Y, NP      . LORazepam (ATIVAN) tablet 1 mg  1 mg Oral Q6H PRN Charm RingsLord, Jamison Y, NP   1 mg at 10/07/19 2130  . magnesium hydroxide (MILK OF MAGNESIA) suspension 30 mL  30 mL Oral Daily PRN Charm RingsLord, Jamison Y, NP      . multivitamin with minerals tablet 1 tablet  1 tablet Oral Daily Antonieta Pertlary,  Greg Lawson, MD   1 tablet at 10/08/19 0755  . nicotine (NICODERM CQ - dosed in mg/24 hours) patch 21 mg  21 mg Transdermal Daily Antonieta Pertlary, Greg Lawson, MD   21 mg at 10/08/19 0755  . ondansetron (ZOFRAN-ODT) disintegrating tablet 4 mg  4 mg Oral Q6H PRN Charm RingsLord, Jamison Y, NP      .  propranolol (INDERAL) tablet 10 mg  10 mg Oral BID Antonieta Pert, MD   10 mg at 10/08/19 0755  . sertraline (ZOLOFT) tablet 100 mg  100 mg Oral Daily Karsten Ro, MD   100 mg at 10/08/19 0755  . thiamine tablet 100 mg  100 mg Oral Daily Karsten Ro, MD   100 mg at 10/08/19 0755  . traZODone (DESYREL) tablet 50 mg  50 mg Oral QHS PRN Antonieta Pert, MD   50 mg at 10/07/19 2130    Lab Results: No results found for this or any previous visit (from the past 48 hour(s)).  Blood Alcohol level:  Lab Results  Component Value Date   ETH 332 (HH) 10/04/2019    Metabolic Disorder Labs: No results found for: HGBA1C, MPG No results found for: PROLACTIN No results found for: CHOL, TRIG, HDL, CHOLHDL, VLDL, LDLCALC  Physical Findings: AIMS: Facial and Oral Movements Muscles of Facial Expression: None, normal Lips and Perioral Area: None, normal Jaw: None, normal Tongue: None, normal,Extremity Movements Upper (arms, wrists, hands, fingers): None, normal Lower (legs, knees, ankles, toes): None, normal, Trunk Movements Neck, shoulders, hips: None, normal, Overall Severity Severity of abnormal movements (highest score from questions above): None, normal Incapacitation due to abnormal movements: None, normal Patient's awareness of abnormal movements (rate only patient's report): No Awareness, Dental Status Current problems with teeth and/or dentures?: No Does patient usually wear dentures?: No  CIWA:  CIWA-Ar Total: 1 COWS:     Musculoskeletal: Strength & Muscle Tone: within normal limits Gait & Station: normal Patient leans: N/A  Psychiatric Specialty Exam: Physical Exam  Review of Systems  Blood  pressure 118/85, pulse 93, temperature 97.8 F (36.6 C), temperature source Oral, resp. rate 16, height 5\' 8"  (1.727 m), weight 72.6 kg, SpO2 99 %.Body mass index is 24.33 kg/m.  General Appearance: Fairly Groomed  Eye Contact:  Good  Speech:  Clear and Coherent  Volume:  Decreased  Mood:  Euthymic  Affect:  Appropriate  Thought Process:  Coherent  Orientation:  Full (Time, Place, and Person)  Thought Content:  Negative  Suicidal Thoughts:  No  Homicidal Thoughts:  No  Memory:  Immediate;   Good Recent;   Good Remote;   Good  Judgement:  Good  Insight:  Fair  Psychomotor Activity:  Negative  Concentration:  Concentration: Good  Recall:  Fair  Fund of Knowledge:  Good  Language:  Good  Akathisia:  Negative  Handed:  Right  AIMS (if indicated):     Assets:  Desire for Improvement  ADL's:  Intact  Cognition:  WNL  Sleep:  Number of Hours: 6.75     Treatment Plan Summary: Pt was admitted with above mentioned psychiatric history. In the ED, CBC, LFT and Comprehensive panel was done. CBC, LFT's were normal. Glucose was 99, Anion gap was 16 and Urine toxicoloy was negative. His BAL on admission was 332.  Pt was seen and examined today. Pt states he feels pretty good today.Pt states his mood is good now and has no complaints. Pt states the nicotine patch is working better and he doesn't have any craving for cigarettes anymore. Pt states he doesn't have any withdrawal symptoms. Pt denies any sweating, tremors and nervousness. Pt is hopeful for the future and looking forward to a rehab facility like [facility]. Pt states he is little sad that his sister is coming with his kids and he is not sure if he will be able to see her family  before moving to long term rehab facility. Social worker is also helping to find a place for rehab. Pt denies any suicidal and homicidal ideation. Pt denies episodes of high energy, increased grandiosity and, visual, auditory and tactile hallucinations. Pt states his  energy and appetite are good. Pt states he had 7-8 hours of sleep last night and ate his meals. No other complaints. No new labs. Pt is currently on Lorazepam 1 mg 6 Hourly PRN, Multivitamin, Sertraline 100 mg OD, Nicotine 21mg  Patch, Zofran 4mg  Q6H PRN, Ensure, Folic acid 1 mg OD, Propanolol 10 mg BID.  Daily contact with patient to assess and evaluate symptoms and progress in treatment   Plan -Continue medications. -Detox protocol in place. - Watch for withdrawal symptoms- anxiety, tremors, sweating, nervousness, delirium tremens. -Monitor vitals. -Continue Nicotine patch. -Social worker will help in finding rehab facilities.   , MD 10/08/2019, 11:48 AM

## 2019-10-08 NOTE — Progress Notes (Signed)
   10/08/19 0751  Vital Signs  Pulse Rate 93  Resp 16  BP 118/85  BP Location Left Arm  BP Method Automatic  Patient Position (if appropriate) Standing  D: Patient denies SI/HI/AVH. Patient rated depression 1/10. Patient out in open areas. A:  Patient took scheduled medicine.  Support and encouragement provided Routine safety checks conducted every 15 minutes. Patient  Informed to notify staff with any concerns.   R:  Safety maintained.

## 2019-10-08 NOTE — Clinical Social Work Note (Signed)
Confirmed with Victor [name] that he is wanting to go into residential substance abuse treatment, and that he will have a co-pay associated with his stay.  He states he is employed by [employer], that he has some savings, that he would like to work out a payment plan if possible, and that he does not have a preference of facility.  Spoke with Santina Evans at [facility] who requested his insurance information, and asked that he give her a call.  Spoke with Misty Stanley at [facility] who requested same.  Spoke with Selena Batten at [facility] who apologized for being woefully behind this AM due to [place] machine breakdown, and to call back later.

## 2019-10-09 MED ORDER — TRAZODONE HCL 50 MG PO TABS: 50.0000 mg | ORAL_TABLET | Freq: Every evening | ORAL | 0 refills | Status: DC | PRN

## 2019-10-09 MED ORDER — SERTRALINE HCL 100 MG PO TABS: 100.0000 mg | ORAL_TABLET | Freq: Every day | ORAL | 0 refills | Status: DC

## 2019-10-09 MED ORDER — PROPRANOLOL HCL 10 MG PO TABS: 10.0000 mg | ORAL_TABLET | Freq: Two times a day (BID) | ORAL | 0 refills | Status: DC

## 2019-10-09 MED ORDER — NICOTINE 21 MG/24HR TD PT24: 21.0000 mg | MEDICATED_PATCH | Freq: Every day | TRANSDERMAL | 0 refills | Status: DC

## 2019-10-09 MED ORDER — HYDROXYZINE HCL 25 MG PO TABS: 25.0000 mg | ORAL_TABLET | Freq: Three times a day (TID) | ORAL | 0 refills | Status: DC | PRN

## 2019-10-09 NOTE — Progress Notes (Signed)
   10/09/19 0100  Psych Admission Type (Psych Patients Only)  Admission Status Voluntary  Psychosocial Assessment  Patient Complaints Anxiety  Eye Contact Fair  Facial Expression Flat  Affect Appropriate to circumstance  Speech Logical/coherent  Interaction Assertive  Motor Activity Fidgety  Appearance/Hygiene Unremarkable  Behavior Characteristics Anxious  Mood Depressed  Thought Process  Coherency WDL  Content WDL  Delusions None reported or observed  Perception WDL  Hallucination None reported or observed  Judgment Impaired  Confusion None  Danger to Self  Current suicidal ideation? Denies  Danger to Others  Danger to Others None reported or observed

## 2019-10-09 NOTE — Discharge Summary (Addendum)
Physician Discharge Summary Note  Patient:  Victor [name] is an 29 y.o., male MRN:  [id number] DOB:  01-20-1991 Patient phone:  [phone] (home)  Patient address:   [address] [place] [id number],  Total Time spent with patient: 20 minutes  Date of Admission:  10/05/2019 Date of Discharge: 10/09/2019  Reason for Admission:  Pt is a 29 year old male with past history of ADHD, Alcohol Abuse Disorder, Depression, Social Anxiety who came walk in yesterday to [facility] for evaluation of his drinking problem and vague suicidal thoughts.  Principal Problem: Major depressive disorder, single episode, severe without psychotic features (HCC) Discharge Diagnoses: Principal Problem:   Major depressive disorder, single episode, severe without psychotic features (HCC) Active Problems:   Alcohol use disorder, severe, dependence (HCC)   Major depressive disorder, single episode, severe without psychosis (HCC)   Past Psychiatric History: ADHD, Social Anxiety, Alcohol use Disorder, Depression  Past Medical History:  Past Medical History:  Diagnosis Date   Acid reflux    ADHD (attention deficit hyperactivity disorder)    Anxiety    Hiatal hernia    Panic attack     Past Surgical History:  Procedure Laterality Date   LAPAROSCOPIC APPENDECTOMY N/A 08/03/2015   Procedure: APPENDECTOMY LAPAROSCOPIC;  Surgeon: Manus RuddMatthew Tsuei, MD;  Location: [facility] OR;  Service: General;  Laterality: N/A;   Family History:  Family History  Problem Relation Age of Onset   Colon cancer Neg Hx    Esophageal cancer Neg Hx    Rectal cancer Neg Hx    Stomach cancer Neg Hx    Family Psychiatric  History: None Social History:  Social History   Substance and Sexual Activity  Alcohol Use Yes   Alcohol/week: 13.0 - 20.0 standard drinks   Types: 12 - 18 Cans of beer, 1 - 2 Glasses of wine per week   Comment: daily     Social History   Substance and Sexual Activity  Drug Use Not Currently   Types:  Cocaine   Comment: occ    Social History   Socioeconomic History   Marital status: Single    Spouse name: n/a   Number of children: 0   Years of education: Associates   Highest education level: Not on file  Occupational History   Occupation: Chief Executive OfficerHidden Dog Fence Installation  Tobacco Use   Smoking status: Current Every Day Smoker    Packs/day: 0.50    Types: Cigarettes    Last attempt to quit: 04/01/2015    Years since quitting: 4.5   Smokeless tobacco: Never Used  Vaping Use   Vaping Use: Former  Substance and Sexual Activity   Alcohol use: Yes    Alcohol/week: 13.0 - 20.0 standard drinks    Types: 12 - 18 Cans of beer, 1 - 2 Glasses of wine per week    Comment: daily   Drug use: Not Currently    Types: Cocaine    Comment: occ   Sexual activity: Not on file  Other Topics Concern   Not on file  Social History Narrative   Lives with his girlfriend currently    Right handed   Caffeine: about 1 can soda/day   Social Determinants of Health   Financial Resource Strain:    Difficulty of Paying Living Expenses:   Food Insecurity:    Worried About Programme researcher, broadcasting/film/videounning Out of Food in the Last Year:    Ran Out of Food in the Last Year:   Transportation Needs:    Lack  of Transportation (Medical):    Lack of Transportation (Non-Medical):   Physical Activity:    Days of Exercise per Week:    Minutes of Exercise per Session:   Stress:    Feeling of Stress :   Social Connections:    Frequency of Communication with Friends and Family:    Frequency of Social Gatherings with Friends and Family:    Attends Religious Services:    Active Member of Clubs or Organizations:    Attends Banker Meetings:    Marital Status:     Hospital Course:  Pt is a 29 year old male with past history of ADHD, Alcohol Abuse Disorder, Depression, Social Anxiety who came walk in yesterday to [facility] for evaluation of his drinking problem. At evaluation he was too confused and  drunk and had vague thoughts of suicidal. Pt was sent to ED for evaluation and was admitted today to [facility] for management of depression symptoms and Detox. Pt has been drinking since Age 38. Pt states he came here for Rehab and was not sure about suicidal thoughts as he was drunk and confused. Pt states he has been depressed for his whole life off and on and has been taking Zoloft 100 mg for last 2 weeks. Pt has been seen a therapist and a Therapist, sports at [employer] over Psychiatry. Pt states his dog died recently and he broke up with his girl friend which has worsen his alcohol problem, depression and anxiety. Pt states he drinks 12-18 Beers with wine every night. Pt takes Adderall 25 mg for ADHD to improve concentration which was started 8 years ago. Pt reported low and depressed mood, low energy, anhedonia, and difficulty in sleep. Pt states he generally has 5-6 hours of sleep overnight. Pt denies any suicidal and homicidal ideation. Pt denies episodes of high energy, increased grandiosity and, visual, auditory and tactile hallucinations. Pt states sometimes he drinks first thing in the morning and feels guilty about drinking. Pt states one night he woke up on the floor and not sure of he had seizure. Pt states he tried to cut down on his alcohol and has tried AA 6 years ago but it didn't help. Pt has no access to weapons and feels safe outside home. Pt denies any previous suicidal attempts or thoughts.In the ED, CBC, LFT and Comprehensive panel was done. CBC, LFT's were normal. Glucose was 99, Anion gap was 16 and Urine toxicoloy was negative. His BAL on admission was 332. During inpatient pt was treated with Detox regimen with Lorazepam 6 hourly PRN and Zoloft 100 mg, thiamine, Folic Acid, Nicotine patch and Multivitamins. Initially pt had withdrawal symptoms such as anxiety, nervousness and sweating. Later on he felt better and no SI, HI were reported. He stated that his anxiety is better but he worries about  future. Pt was also enrolled & participated in the group counseling sessions being offered & held on this unit. Pt learned coping skills. Pt tolerated her treatment medications without any adverse effects or reactions reported.   During his stay Patient did not display any dangerous, violent or suicidal behavior on the unit.  He interacted with patients & staff appropriately, participated appropriately in the group sessions/therapies. His medications were addressed & adjusted to meet her needs. At the time of discharge, patient is not reporting any acute suicidal/homicidal ideations. Pt currently denies any new issues or concerns. Education and supportive counseling provided throughout her hospital stay & upon discharge Pt was seen and examined today  before discharged. Pt is accepted at [facility]. Pt is feeling much better and hopeful for the future. Pt states his mood is better and he doesn't feel depressed but feels anxious about what the future holds for him. Currently, Pt denies any suicidal ideation, homicidal ideation and, visual and auditory hallucination. Pt denies any paranoia. Pt denies any changes in appetite and slept well last night. Pt states his energy is better and but he feels fatigue. Pt states he had a nightmare last night about drinking. Pt states he is setting up goals for his future and will work hard on them and will stay away from Alcohol and Drugs. Pt denies any withdrawal symptoms such as nervousness, tremors and sweating.Pt was accepted at [facility] facility. He is being dischaged to home for tonight. Pt will check in himself at [facility] facility tomorrow morning. Pt was instructed to stay away from alcohol or other drugs.   Physical Findings: AIMS: Facial and Oral Movements Muscles of Facial Expression: None, normal Lips and Perioral Area: None, normal Jaw: None, normal Tongue: None, normal,Extremity Movements Upper (arms, wrists,  hands, fingers): None, normal Lower (legs, knees, ankles, toes): None, normal, Trunk Movements Neck, shoulders, hips: None, normal, Overall Severity Severity of abnormal movements (highest score from questions above): None, normal Incapacitation due to abnormal movements: None, normal Patient's awareness of abnormal movements (rate only patient's report): No Awareness, Dental Status Current problems with teeth and/or dentures?: No Does patient usually wear dentures?: No  CIWA:  CIWA-Ar Total: 0 COWS:     Musculoskeletal: Strength & Muscle Tone: within normal limits Gait & Station: normal Patient leans: N/A  Psychiatric Specialty Exam: Physical Exam  Review of Systems  Blood pressure 106/79, pulse 74, temperature 97.8 F (36.6 C), temperature source Oral, resp. rate 16, height 5\' 8"  (1.727 m), weight 72.6 kg, SpO2 99 %.Body mass index is 24.33 kg/m.  General Appearance: Fairly Groomed  Eye Contact:  Good  Speech:  Clear and Coherent  Volume:  Normal  Mood:  Anxious  Affect:  Appropriate  Thought Process:  Coherent  Orientation:  Full (Time, Place, and Person)  Thought Content:  Negative  Suicidal Thoughts:  No  Homicidal Thoughts:  No  Memory:  Immediate;   Good Recent;   Good Remote;   Good  Judgement:  Good  Insight:  Fair  Psychomotor Activity:  Negative  Concentration:  Concentration: Good  Recall:  Good  Fund of Knowledge:  Good  Language:  Good  Akathisia:  Negative  Handed:  Right  AIMS (if indicated):     Assets:  Desire for Improvement Physical Health Social Support  ADL's:  Intact  Cognition:  WNL  Sleep:  Number of Hours: 6.75     Have you used any form of tobacco in the last 30 days? (Cigarettes, Smokeless Tobacco, Cigars, and/or Pipes): Yes  Has this patient used any form of tobacco in the last 30 days? (Cigarettes, Smokeless Tobacco, Cigars, and/or Pipes) Yes, N/A, Prescription for Nicotine patch provided.  Blood Alcohol level:  Lab Results   Component Value Date   ETH 332 (HH) 10/04/2019    Metabolic Disorder Labs:  No results found for: HGBA1C, MPG No results found for: PROLACTIN No results found for: CHOL, TRIG, HDL, CHOLHDL, VLDL, LDLCALC  See Psychiatric Specialty Exam and Suicide Risk Assessment completed by Attending Physician prior to discharge.  Discharge destination:  Home  Is patient on multiple antipsychotic therapies at discharge:  No  Has Patient had three or more failed trials of antipsychotic monotherapy by history:  No  Recommended Plan for Multiple Antipsychotic Therapies: NA   Allergies as of 10/09/2019   No Known Allergies     Medication List    STOP taking these medications   ALPRAZolam 0.5 MG tablet Commonly known as: XANAX   amphetamine-dextroamphetamine 10 MG tablet Commonly known as: ADDERALL   amphetamine-dextroamphetamine 25 MG 24 hr capsule Commonly known as: Adderall XR   ibuprofen 800 MG tablet Commonly known as: ADVIL   methocarbamol 500 MG tablet Commonly known as: ROBAXIN   omeprazole 40 MG capsule Commonly known as: PRILOSEC     TAKE these medications     Indication  hydrOXYzine 25 MG tablet Commonly known as: ATARAX/VISTARIL Take 1 tablet (25 mg total) by mouth 3 (three) times daily as needed for anxiety.  Indication: Feeling Anxious   nicotine 21 mg/24hr patch Commonly known as: NICODERM CQ - dosed in mg/24 hours Place 1 patch (21 mg total) onto the skin daily. (May buy from over the counter): Smoking cessation Start taking on: October 10, 2019  Indication: Nicotine Addiction   propranolol 10 MG tablet Commonly known as: INDERAL Take 1 tablet (10 mg total) by mouth 2 (two) times daily. For anxiety What changed:   how much to take  how to take this  when to take this  additional instructions  Indication: Feeling Anxious   sertraline 100 MG tablet Commonly known as: ZOLOFT Take 1 tablet (100 mg total) by mouth daily. For depression Start taking on:  October 10, 2019 What changed:   medication strength  how much to take  how to take this  when to take this  additional instructions  Indication: Major Depressive Disorder   traZODone 50 MG tablet Commonly known as: DESYREL Take 1 tablet (50 mg total) by mouth at bedtime as needed for sleep.  Indication: Trouble Sleeping       Follow-up Information    [facility] Follow up.   Why: Intake is scheduled for 10:30AM on 10/10/2019.  Thanks! Contact information: [address] [place] [address] [phone]               Follow-up recommendations:  Activity:  As Tolerated Diet:  As recommended by your primary care doctor.  Comments:  Prescriptions given at discharge.  Patient agreeable to plan.  Given opportunity to ask questions.  Appears to feel comfortable with discharge denies any current suicidal or homicidal thought. Patient is also instructed prior to discharge to: Take all medications as prescribed by his/her mental healthcare provider. Report any adverse effects and or reactions from the medicines to his/her outpatient provider promptly. Patient has been instructed & cautioned: To not engage in alcohol and or illegal drug use while on prescription medicines. In the event of worsening symptoms, patient is instructed to call the crisis hotline, 911 and or go to the nearest ED for appropriate evaluation and treatment of symptoms. To follow-up with his/her primary care provider for your other medical issues, concerns and or health care needs.  Signed: Karsten Ro, MD 10/09/2019, 10:44 AM

## 2019-10-09 NOTE — Progress Notes (Signed)
Discharge Note:  Patient denies SI/HI AVH at this time. Discharge instructions, AVS, prescriptions and transition record gone over with patient. Patient agrees to comply with medication management, follow-up visit, and outpatient therapy. Patient belongings returned to patient. Patient questions and concerns addressed and answered.  Patient ambulatory off unit.  Patient discharged to home with mom.  

## 2019-10-09 NOTE — Progress Notes (Signed)
  [facility] Adult Case Management Discharge Plan :  Will you be returning to the same living situation after discharge:  Yes,  pt reports that he will return home for one night then go to [facility] on 10/10/19. At discharge, do you have transportation home?: Yes,  pt reports that his mother will provide transportaiton. Do you have the ability to pay for your medications: Yes,  [employer].  Release of information consent forms completed and in the chart;  Patient's signature needed at discharge.  Patient to Follow up at:  Follow-up Information    [facility] Follow up.   Why: Intake is scheduled for 10:30AM on 10/10/2019.  Thanks! Contact information: [address] [place] [address] [phone]               Next level of care provider has access to [facility] Link:no  Safety Planning and Suicide Prevention discussed: Yes,  SPE completed with pt's mother.  Have you used any form of tobacco in the last 30 days? (Cigarettes, Smokeless Tobacco, Cigars, and/or Pipes): Yes  Has patient been referred to the Quitline?: Patient refused referral  Patient has been referred for addiction treatment: Pt. refused referral  Harden Mo, LCSW 10/09/2019, 10:13 AM

## 2019-10-09 NOTE — [facility] Suicide Risk Assessment (Signed)
[facility] Discharge Suicide Risk Assessment   Principal Problem: Major depressive disorder, single episode, severe without psychotic features (HCC) Discharge Diagnoses: Principal Problem:   Major depressive disorder, single episode, severe without psychotic features (HCC) Active Problems:   Alcohol use disorder, severe, dependence (HCC)   Major depressive disorder, single episode, severe without psychosis (HCC)   Total Time spent with patient: 15 minutes  Musculoskeletal: Strength & Muscle Tone: within normal limits Gait & Station: normal Patient leans: N/A  Psychiatric Specialty Exam: Review of Systems  All other systems reviewed and are negative.   Blood pressure 106/79, pulse 74, temperature 97.8 F (36.6 C), temperature source Oral, resp. rate 16, height 5\' 8"  (1.727 m), weight 72.6 kg, SpO2 99 %.Body mass index is 24.33 kg/m.  General Appearance: Casual  Eye Contact::  Good  Speech:  Normal Rate409  Volume:  Normal  Mood:  Euthymic  Affect:  Congruent  Thought Process:  Coherent and Descriptions of Associations: Intact  Orientation:  Full (Time, Place, and Person)  Thought Content:  Logical  Suicidal Thoughts:  No  Homicidal Thoughts:  No  Memory:  Immediate;   Good Recent;   Good Remote;   Good  Judgement:  Intact  Insight:  Fair  Psychomotor Activity:  Normal  Concentration:  Good  Recall:  Good  Fund of Knowledge:Good  Language: Good  Akathisia:  Negative  Handed:  Right  AIMS (if indicated):     Assets:  Communication Skills Desire for Improvement Financial Resources/Insurance Housing Resilience Social Support Talents/Skills  Sleep:  Number of Hours: 6.75  Cognition: WNL  ADL's:  Intact   Mental Status Per Nursing Assessment::   On Admission:  Self-harm behaviors  Demographic Factors:  Male and Caucasian  Loss Factors: NA  Historical Factors: Impulsivity  Risk Reduction Factors:   Living with another person, especially a relative and  Positive social support  Continued Clinical Symptoms:  Depression:   Comorbid alcohol abuse/dependence Impulsivity Alcohol/Substance Abuse/Dependencies  Cognitive Features That Contribute To Risk:  None    Suicide Risk:  Minimal: No identifiable suicidal ideation.  Patients presenting with no risk factors but with morbid ruminations; may be classified as minimal risk based on the severity of the depressive symptoms   Follow-up Information    [facility] Go on 10/14/2019.   Specialty: Behavioral Health Why: You have an appointment for therapy with 12/15/2019 on 10/14/19 at 11:00 am. You also have an appointment for medication management with 12/15/19, NP on 10/18/19 at 9:00 am.  Both appointments will be held in person.     Contact information: [address] [place] [phone]        [facility] Follow up.   Specialty: Addiction Medicine Why: A referral has been made on your behalf to this provider.  Contact information: [address] [place] [phone]        [phone] Follow up.   Why: A referral has been made on your behalf to this provider.  Contact information: [address] [place] [phone]               Plan Of Care/Follow-up recommendations:  Activity:  ad lib  [phone], MD 10/09/2019, 9:19 AM

## 2019-10-11 ENCOUNTER — Ambulatory Visit: Payer: Commercial Managed Care - PPO | Admitting: Psychiatry

## 2019-10-14 ENCOUNTER — Ambulatory Visit: Payer: Commercial Managed Care - PPO | Admitting: Addiction (Substance Use Disorder)

## 2019-10-14 ENCOUNTER — Telehealth: Payer: Self-pay

## 2019-10-14 NOTE — Telephone Encounter (Signed)
Lm for pt to make an urgent appt before FMLA can be completed.

## 2019-10-14 NOTE — Telephone Encounter (Signed)
Received paperwork on 10/10/2019  from [employer] for FMLA, appears patient was admitted to hospital on 10/05/2019 Last apt with Shanda Bumps was 07/12/2019   Will check with Shanda Bumps if needs follow up apt before completing paperwork. No apt scheduled at this time.

## 2019-10-18 ENCOUNTER — Ambulatory Visit: Payer: Commercial Managed Care - PPO | Admitting: Addiction (Substance Use Disorder)

## 2019-10-18 ENCOUNTER — Ambulatory Visit: Payer: Commercial Managed Care - PPO | Admitting: Psychiatry

## 2019-11-01 ENCOUNTER — Ambulatory Visit: Payer: Commercial Managed Care - PPO | Admitting: Addiction (Substance Use Disorder)

## 2019-11-15 ENCOUNTER — Ambulatory Visit (INDEPENDENT_AMBULATORY_CARE_PROVIDER_SITE_OTHER): Payer: Commercial Managed Care - PPO | Admitting: Addiction (Substance Use Disorder)

## 2019-11-15 ENCOUNTER — Encounter: Payer: Self-pay | Admitting: Addiction (Substance Use Disorder)

## 2019-11-15 ENCOUNTER — Other Ambulatory Visit: Payer: Self-pay

## 2019-11-15 DIAGNOSIS — F1020 Alcohol dependence, uncomplicated: Secondary | ICD-10-CM | POA: Diagnosis not present

## 2019-11-15 DIAGNOSIS — F411 Generalized anxiety disorder: Secondary | ICD-10-CM | POA: Diagnosis not present

## 2019-11-15 DIAGNOSIS — F330 Major depressive disorder, recurrent, mild: Secondary | ICD-10-CM
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
## 2019-11-22 ENCOUNTER — Encounter: Payer: Self-pay | Admitting: Psychiatry

## 2019-11-22 ENCOUNTER — Ambulatory Visit (INDEPENDENT_AMBULATORY_CARE_PROVIDER_SITE_OTHER): Payer: Commercial Managed Care - PPO | Admitting: Psychiatry

## 2019-11-22 ENCOUNTER — Other Ambulatory Visit: Payer: Self-pay

## 2019-11-22 VITALS — BP 122/73 | HR 71 | Wt 175.0 lb

## 2019-11-22 DIAGNOSIS — F4010 Social phobia, unspecified: Secondary | ICD-10-CM | POA: Diagnosis not present

## 2019-11-22 DIAGNOSIS — G4700 Insomnia, unspecified: Secondary | ICD-10-CM | POA: Diagnosis not present

## 2019-11-22 DIAGNOSIS — F411 Generalized anxiety disorder: Secondary | ICD-10-CM

## 2019-11-22 DIAGNOSIS — F1021 Alcohol dependence, in remission: Secondary | ICD-10-CM

## 2019-11-22 MED ORDER — SERTRALINE HCL 100 MG PO TABS: 150.0000 mg | ORAL_TABLET | Freq: Every day | ORAL | 1 refills | Status: DC

## 2019-11-22 MED ORDER — QUETIAPINE FUMARATE 100 MG PO TABS: 100.0000 mg | ORAL_TABLET | Freq: Every evening | ORAL | 0 refills | Status: DC | PRN

## 2019-11-22 MED ORDER — PROPRANOLOL HCL 10 MG PO TABS: ORAL_TABLET | ORAL | 2 refills | Status: DC

## 2019-11-22 MED ORDER — NALTREXONE HCL 50 MG PO TABS: 50.0000 mg | ORAL_TABLET | Freq: Every day | ORAL | 1 refills | Status: DC

## 2019-11-22 NOTE — Progress Notes (Signed)
[name] [id number] 08-17-1990 29 y.o.  Subjective:   Patient ID:  Victor [name] is a 29 y.o. (DOB 04/30/1990) male.  Chief Complaint:  Chief Complaint  Patient presents with  . Follow-up    Anxiety, Depression  . Alcohol Problem    HPI Tong Pieczynski presents to the office today for follow-up of anxiety, depression, and ETOH dependence. Since last visit he was admitted to the hospital for detox and then went to rehab at [facility]   He reports that he had been drinking heavily and then his dog died a few weeks before hospitalization and ETOH use escalated. Reports that he may have had a seizure prior to hospitalization. He reports that he has been going to AA meetings nightly and seeing therapist regularly. "I think most of my problems are caused by drinking." Denies ETOH cravings. Denies ETOH use since hospitalization.  He reports that he continues to have social anxiety, particularly when he goes to AA meethings. Notices his voice will shake and experience sweating and flushing. He reports that generalized anxiety has improved. He reports that rumination and increased anxiety in the morning has improved. Denies panic attacks.   He describes his mood as "optimistic." He reports that Sertaline has been helpful for his mood. He reports occasional sadness, typically when thinking about past relationships. Denies irritability. Sleep improved with Naltrexone and Seroquel. Has had vivid dreams and talking in his sleep almost every night this week. Sleeping about 7 hours a night. Appetite has been normal. Energy and motivation have been ok. Some procrastination. Concentration has been adequate. Denies SI.   43 days sober. Returned to work this week and this went ok. Reports that work has been supportive. Reports that he has taken Xanax prn on a few occasions.   Past Psychiatric Medication Trials: Propranolol- Somewhat effective Gabapentin-ineffective Adderall  XR Adderall Vyvanse Xanax Hydroxyzine Trazodone- Ineffective Sertraline Seroquel- Ineffective at 50 mg dose. Helpful for sleep at 100 mg dose. Naltrexone Carbamazepine- prescribed for detox  PHQ2-9     Office Visit from 07/01/2015 in Primary Care at [facility] Total Score 0       Review of Systems:  Review of Systems  Musculoskeletal: Negative for gait problem.  Neurological: Negative for tremors.  Psychiatric/Behavioral:       Please refer to HPI    Medications: I have reviewed the patient's current medications.  Current Outpatient Medications  Medication Sig Dispense Refill  . naltrexone (DEPADE) 50 MG tablet Take 1 tablet (50 mg total) by mouth at bedtime. 90 tablet 1  . QUEtiapine (SEROQUEL) 100 MG tablet Take 1 tablet (100 mg total) by mouth at bedtime as needed. 90 tablet 0  . sertraline (ZOLOFT) 100 MG tablet Take 1.5 tablets (150 mg total) by mouth daily. For depression 45 tablet 1  . propranolol (INDERAL) 10 MG tablet Take 1-2 tabs po BID prn anxiety 60 tablet 2   No current facility-administered medications for this visit.    Medication Side Effects: Other: Possible vivid dreams  Allergies: No Known Allergies  Past Medical History:  Diagnosis Date  . Acid reflux   . ADHD (attention deficit hyperactivity disorder)   . Anxiety   . Hiatal hernia   . Panic attack     Family History  Problem Relation Age of Onset  . Colon cancer Neg Hx   . Esophageal cancer Neg Hx   . Rectal cancer Neg Hx   . Stomach cancer Neg Hx     Social History  Socioeconomic History  . Marital status: Single    Spouse name: n/a  . Number of children: 0  . Years of education: Associates  . Highest education level: Not on file  Occupational History  . Occupation: Chief Executive Officer  Tobacco Use  . Smoking status: Current Every Day Smoker    Packs/day: 0.50    Types: Cigarettes    Last attempt to quit: 04/01/2015    Years since quitting: 4.6  . Smokeless  tobacco: Never Used  Vaping Use  . Vaping Use: Former  Substance and Sexual Activity  . Alcohol use: Yes    Alcohol/week: 13.0 - 20.0 standard drinks    Types: 12 - 18 Cans of beer, 1 - 2 Glasses of wine per week    Comment: daily  . Drug use: Not Currently    Types: Cocaine    Comment: occ  . Sexual activity: Not on file  Other Topics Concern  . Not on file  Social History Narrative   Lives with his girlfriend currently    Right handed   Caffeine: about 1 can soda/day   Social Determinants of Health   Financial Resource Strain:   . Difficulty of Paying Living Expenses: Not on file  Food Insecurity:   . Worried About Programme researcher, broadcasting/film/video in the Last Year: Not on file  . Ran Out of Food in the Last Year: Not on file  Transportation Needs:   . Lack of Transportation (Medical): Not on file  . Lack of Transportation (Non-Medical): Not on file  Physical Activity:   . Days of Exercise per Week: Not on file  . Minutes of Exercise per Session: Not on file  Stress:   . Feeling of Stress : Not on file  Social Connections:   . Frequency of Communication with Friends and Family: Not on file  . Frequency of Social Gatherings with Friends and Family: Not on file  . Attends Religious Services: Not on file  . Active Member of Clubs or Organizations: Not on file  . Attends Banker Meetings: Not on file  . Marital Status: Not on file  Intimate Partner Violence:   . Fear of Current or Ex-Partner: Not on file  . Emotionally Abused: Not on file  . Physically Abused: Not on file  . Sexually Abused: Not on file    Past Medical History, Surgical history, Social history, and Family history were reviewed and updated as appropriate.   Please see review of systems for further details on the patient's review from today.   Objective:   Physical Exam:  BP 122/73   Pulse 71   Wt 175 lb (79.4 kg)   BMI 26.61 kg/m   Physical Exam  Lab Review:     Component Value Date/Time    NA 139 10/04/2019 1947   K 4.5 10/04/2019 1947   CL 101 10/04/2019 1947   CO2 22 10/04/2019 1947   GLUCOSE 99 10/04/2019 1947   BUN 11 10/04/2019 1947   CREATININE 0.93 10/04/2019 1947   CALCIUM 9.0 10/04/2019 1947   PROT 8.5 (H) 10/04/2019 1947   ALBUMIN 5.1 (H) 10/04/2019 1947   AST 53 (H) 10/04/2019 1947   ALT 55 (H) 10/04/2019 1947   ALKPHOS 59 10/04/2019 1947   BILITOT 0.6 10/04/2019 1947   GFRNONAA >60 10/04/2019 1947   GFRAA >60 10/04/2019 1947       Component Value Date/Time   WBC 6.5 10/04/2019 1947   RBC 5.25 10/04/2019  1947   HGB 16.1 10/04/2019 1947   HCT 46.0 10/04/2019 1947   PLT 241 10/04/2019 1947   MCV 87.6 10/04/2019 1947   MCH 30.7 10/04/2019 1947   MCHC 35.0 10/04/2019 1947   RDW 12.7 10/04/2019 1947   LYMPHSABS 2.4 06/06/2018 1726   MONOABS 0.5 06/06/2018 1726   EOSABS 0.2 06/06/2018 1726   BASOSABS 0.1 06/06/2018 1726    No results found for: POCLITH, LITHIUM   No results found for: PHENYTOIN, PHENOBARB, VALPROATE, CBMZ   .res Assessment: Plan:   Discussed potential benefits, risks, and side effects of increasing sertraline to 150 mg daily to improve social anxiety.  Patient agrees to increase in sertraline. Discussed avoiding continued use of alprazolam due to possible risks of abuse and dependence. Discussed using propanolol 10 to 20 mg as needed about 30 minutes prior to AA meetings and other events that could trigger anxiety. Continue Seroquel 100 mg at bedtime for insomnia. Will continue naltrexone 50 mg at bedtime for alcohol dependence.  Patient verbalizes understanding that naltrexone could interfere with pain medications. Recommend continuing psychotherapy with [name], LCSW. Patient to follow-up with this provider in 4 to 6 weeks or sooner if clinically indicated. Patient advised to contact office with any questions, adverse effects, or acute worsening in signs and symptoms.  Raymel was seen today for follow-up and alcohol  problem.  Diagnoses and all orders for this visit:  Alcohol dependence in early full remission (HCC) -     naltrexone (DEPADE) 50 MG tablet; Take 1 tablet (50 mg total) by mouth at bedtime.  Social anxiety disorder -     sertraline (ZOLOFT) 100 MG tablet; Take 1.5 tablets (150 mg total) by mouth daily. For depression -     propranolol (INDERAL) 10 MG tablet; Take 1-2 tabs po BID prn anxiety  Generalized anxiety disorder -     sertraline (ZOLOFT) 100 MG tablet; Take 1.5 tablets (150 mg total) by mouth daily. For depression  Insomnia, unspecified type -     QUEtiapine (SEROQUEL) 100 MG tablet; Take 1 tablet (100 mg total) by mouth at bedtime as needed.     Please see After Visit Summary for patient specific instructions.  Future Appointments  Date Time Provider Department Center  11/29/2019 10:00 AM Pauline Good, LCSW [facility] None  12/06/2019  9:00 AM Pauline Good, LCSW [facility] None  12/20/2019 11:00 AM Pauline Good, LCSW [facility] None  12/27/2019  9:00 AM Pauline Good, LCSW [facility] None  12/27/2019 11:00 AM Corie Chiquito, PMHNP [facility] None  01/03/2020 10:00 AM Pauline Good, LCSW [facility] None  01/10/2020 10:00 AM Pauline Good, LCSW [facility] None    No orders of the defined types were placed in this encounter.   -------------------------------

## 2019-11-29 ENCOUNTER — Other Ambulatory Visit: Payer: Self-pay

## 2019-11-29 ENCOUNTER — Encounter: Payer: Self-pay | Admitting: Addiction (Substance Use Disorder)

## 2019-11-29 ENCOUNTER — Ambulatory Visit (INDEPENDENT_AMBULATORY_CARE_PROVIDER_SITE_OTHER): Payer: Commercial Managed Care - PPO | Admitting: Addiction (Substance Use Disorder)

## 2019-11-29 DIAGNOSIS — F4010 Social phobia, unspecified: Secondary | ICD-10-CM

## 2019-11-29 DIAGNOSIS — F330 Major depressive disorder, recurrent, mild: Secondary | ICD-10-CM

## 2019-11-29 DIAGNOSIS — F1021 Alcohol dependence, in remission: Secondary | ICD-10-CM | POA: Diagnosis not present
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
## 2019-12-06 ENCOUNTER — Encounter: Payer: Self-pay | Admitting: Addiction (Substance Use Disorder)

## 2019-12-06 ENCOUNTER — Other Ambulatory Visit: Payer: Self-pay

## 2019-12-06 ENCOUNTER — Ambulatory Visit (INDEPENDENT_AMBULATORY_CARE_PROVIDER_SITE_OTHER): Payer: Commercial Managed Care - PPO | Admitting: Addiction (Substance Use Disorder)

## 2019-12-06 DIAGNOSIS — F411 Generalized anxiety disorder: Secondary | ICD-10-CM | POA: Diagnosis not present

## 2019-12-06 DIAGNOSIS — F1021 Alcohol dependence, in remission: Secondary | ICD-10-CM
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
## 2019-12-20 ENCOUNTER — Ambulatory Visit (INDEPENDENT_AMBULATORY_CARE_PROVIDER_SITE_OTHER): Payer: Commercial Managed Care - PPO | Admitting: Addiction (Substance Use Disorder)

## 2019-12-20 DIAGNOSIS — F1021 Alcohol dependence, in remission: Secondary | ICD-10-CM | POA: Diagnosis not present

## 2019-12-20 DIAGNOSIS — F411 Generalized anxiety disorder: Secondary | ICD-10-CM
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
## 2019-12-27 ENCOUNTER — Ambulatory Visit: Payer: Commercial Managed Care - PPO | Admitting: Psychiatry

## 2019-12-27 ENCOUNTER — Encounter: Payer: Self-pay | Admitting: Addiction (Substance Use Disorder)

## 2019-12-27 ENCOUNTER — Ambulatory Visit (INDEPENDENT_AMBULATORY_CARE_PROVIDER_SITE_OTHER): Payer: Commercial Managed Care - PPO | Admitting: Addiction (Substance Use Disorder)

## 2019-12-27 ENCOUNTER — Encounter: Payer: Self-pay | Admitting: Psychiatry

## 2019-12-27 ENCOUNTER — Other Ambulatory Visit: Payer: Self-pay

## 2019-12-27 DIAGNOSIS — F411 Generalized anxiety disorder: Secondary | ICD-10-CM

## 2019-12-27 DIAGNOSIS — F1021 Alcohol dependence, in remission: Secondary | ICD-10-CM

## 2019-12-27 DIAGNOSIS — F4010 Social phobia, unspecified: Secondary | ICD-10-CM

## 2019-12-27 DIAGNOSIS — G4700 Insomnia, unspecified: Secondary | ICD-10-CM

## 2019-12-27 DIAGNOSIS — F330 Major depressive disorder, recurrent, mild: Secondary | ICD-10-CM | POA: Diagnosis not present

## 2019-12-27 MED ORDER — SERTRALINE HCL 100 MG PO TABS: 150.0000 mg | ORAL_TABLET | Freq: Every day | ORAL | 1 refills | Status: DC

## 2019-12-27 MED ORDER — QUETIAPINE FUMARATE 50 MG PO TABS: ORAL_TABLET | ORAL | 0 refills | Status: DC

## 2019-12-27 NOTE — Progress Notes (Signed)
Victor [name] [id number] [birth year] 29 y.o.  Subjective:   Patient ID:  Victor [name] is a 29 y.o. (DOB [birth year]) male.  Chief Complaint:  Chief Complaint  Patient presents with  . Follow-up    Anxiety, depression, and insomnia    HPI Victor Folkshomas Janelle presents to the office today for follow-up of depression, anxiety, and insomnia. He reports that he has been feeling more optimistic and less anxious. He reports that social anxiety has been improved with increase in Sertraline to 150 mg po qd. He reports feeling more comfortable in general. Denies any recent anxiety s/s to include worry or panic. Denies depressed mood. He has been sleeping well with Seroquel. Typically sleeping 7-7.5 hours a night. Weird dreams have resolved and is no longer talking in his sleep. Waking up before his alarm. Appetite has been good. Energy and motivation have been good. Concentration has been adequate and is able to focus and retain information he is learning online after work. Denies anhedonia. Denies SI.   Has been attending AA meeting regularly. Has been meeting new friends. He reports that he has occasional dreams about drinking and some fleeting thoughts about drinking. Reports that he is 75 days sober.   Grandmother died a couple of weeks ago and reports that he handled this ok. He reports that grandmother was 28 yo and had COVID.   Work has been going well. He is now working on a job close to home. Leaves home around 6. Gets home around 5 and has classes online until about 7 pm. 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   Past Psychiatric Medication Trials: Propranolol- Somewhat effective Gabapentin-ineffective Adderall XR Adderall Vyvanse Xanax Hydroxyzine Trazodone- Ineffective Sertraline Seroquel- Ineffective at 50 mg dose. Helpful for sleep at 100 mg dose. Naltrexone Carbamazepine- prescribed for detox  PHQ2-9     [facility]  PHQ-2 Total  Score 0       Review of Systems:  Review of Systems  Gastrointestinal:       Acid reflux  Musculoskeletal: Negative for gait problem.  Neurological: Negative for tremors.  Psychiatric/Behavioral:       Please refer to HPI    Medications: I have reviewed the patient's current medications.  Current Outpatient Medications  Medication Sig Dispense Refill  . naltrexone (DEPADE) 50 MG tablet Take 1 tablet (50 mg total) by mouth at bedtime. 90 tablet 1  . omeprazole (PRILOSEC OTC) 20 MG tablet Take 20 mg by mouth daily.    . QUEtiapine (SEROQUEL) 50 MG tablet Take 1-2 tabs po QHS prn insomnia 180 tablet 0  . propranolol (INDERAL) 10 MG tablet Take 1-2 tabs po BID prn anxiety (Patient not taking: Reported on 12/27/2019) 60 tablet 2  . sertraline (ZOLOFT) 100 MG tablet Take 1.5 tablets (150 mg total) by mouth daily. 135 tablet 1   No current facility-administered medications for this visit.    Medication Side Effects: Other: Decreased sexual interest  Allergies: No Known Allergies  Past Medical History:  Diagnosis Date  . Acid reflux   . ADHD (attention deficit hyperactivity disorder)   . Anxiety   . Hiatal hernia   . Panic attack     Family History  Problem Relation Age of Onset  . Colon cancer Neg Hx   . Esophageal cancer Neg Hx   . Rectal cancer Neg Hx   . Stomach cancer Neg Hx     Social History   Socioeconomic History  . Marital  status: Single    Spouse name: n/a  . Number of children: 0  . Years of education: Associates  . Highest education level: Not on file  Occupational History  . Occupation: Chief Executive Officer  Tobacco Use  . Smoking status: Current Every Day Smoker    Packs/day: 0.50    Types: Cigarettes    Last attempt to quit: 04/01/2015    Years since quitting: 4.7  . Smokeless tobacco: Never Used  Vaping Use  . Vaping Use: Former  Substance and Sexual Activity  . Alcohol use: Yes    Alcohol/week: 13.0 - 20.0 standard drinks     Types: 12 - 18 Cans of beer, 1 - 2 Glasses of wine per week    Comment: daily  . Drug use: Not Currently    Types: Cocaine    Comment: occ  . Sexual activity: Not on file  Other Topics Concern  . Not on file  Social History Narrative   Lives with his girlfriend currently    Right handed   Caffeine: about 1 can soda/day   Social Determinants of Health   Financial Resource Strain:   . Difficulty of Paying Living Expenses: Not on file  Food Insecurity:   . Worried About Programme researcher, broadcasting/film/video in the Last Year: Not on file  . Ran Out of Food in the Last Year: Not on file  Transportation Needs:   . Lack of Transportation (Medical): Not on file  . Lack of Transportation (Non-Medical): Not on file  Physical Activity:   . Days of Exercise per Week: Not on file  . Minutes of Exercise per Session: Not on file  Stress:   . Feeling of Stress : Not on file  Social Connections:   . Frequency of Communication with Friends and Family: Not on file  . Frequency of Social Gatherings with Friends and Family: Not on file  . Attends Religious Services: Not on file  . Active Member of Clubs or Organizations: Not on file  . Attends Banker Meetings: Not on file  . Marital Status: Not on file  Intimate Partner Violence:   . Fear of Current or Ex-Partner: Not on file  . Emotionally Abused: Not on file  . Physically Abused: Not on file  . Sexually Abused: Not on file    Past Medical History, Surgical history, Social history, and Family history were reviewed and updated as appropriate.   Please see review of systems for further details on the patient's review from today.   Objective:   Physical Exam:  There were no vitals taken for this visit.  Physical Exam Constitutional:      General: He is not in acute distress. Musculoskeletal:        General: No deformity.  Neurological:     Mental Status: He is alert and oriented to person, place, and time.     Coordination:  Coordination normal.  Psychiatric:        Attention and Perception: Attention and perception normal. He does not perceive auditory or visual hallucinations.        Mood and Affect: Mood normal. Mood is not anxious or depressed. Affect is not labile, blunt, angry or inappropriate.        Speech: Speech normal.        Behavior: Behavior normal.        Thought Content: Thought content normal. Thought content is not paranoid or delusional. Thought content does not include homicidal or suicidal  ideation. Thought content does not include homicidal or suicidal plan.        Cognition and Memory: Cognition and memory normal.        Judgment: Judgment normal.     Comments: Insight intact     Lab Review:     Component Value Date/Time   NA 139 10/04/2019 1947   K 4.5 10/04/2019 1947   CL 101 10/04/2019 1947   CO2 22 10/04/2019 1947   GLUCOSE 99 10/04/2019 1947   BUN 11 10/04/2019 1947   CREATININE 0.93 10/04/2019 1947   CALCIUM 9.0 10/04/2019 1947   PROT 8.5 (H) 10/04/2019 1947   ALBUMIN 5.1 (H) 10/04/2019 1947   AST 53 (H) 10/04/2019 1947   ALT 55 (H) 10/04/2019 1947   ALKPHOS 59 10/04/2019 1947   BILITOT 0.6 10/04/2019 1947   GFRNONAA >60 10/04/2019 1947   GFRAA >60 10/04/2019 1947       Component Value Date/Time   WBC 6.5 10/04/2019 1947   RBC 5.25 10/04/2019 1947   HGB 16.1 10/04/2019 1947   HCT 46.0 10/04/2019 1947   PLT 241 10/04/2019 1947   MCV 87.6 10/04/2019 1947   MCH 30.7 10/04/2019 1947   MCHC 35.0 10/04/2019 1947   RDW 12.7 10/04/2019 1947   LYMPHSABS 2.4 06/06/2018 1726   MONOABS 0.5 06/06/2018 1726   EOSABS 0.2 06/06/2018 1726   BASOSABS 0.1 06/06/2018 1726    No results found for: POCLITH, LITHIUM   No results found for: PHENYTOIN, PHENOBARB, VALPROATE, CBMZ   .res Assessment: Plan:   Patient reports that he has tried not taking Seroquel and has been able to sleep.  Recommended considering gradual dose reduction of Seroquel since rebound insomnia can occur  when Seroquel is stopped abruptly.  Discussed that Seroquel could be changed to 50 mg tablets with instructions 1 to 2 tablets at bedtime so that he can begin to try to reduce dose to 50 mg at bedtime as tolerated or continue 100 mg at bedtime if he experiences insomnia with dose reduction. Continue sertraline 150 mg daily for anxiety and depression. Continue naltrexone 50 mg at bedtime for alcohol dependence in remission. Patient reports that he has not recently needed to take propranolol as needed and a prescription is not needed at this time. Recommend continuing psychotherapy with [name], LCSW. Patient to follow-up with this provider in 2 to 3 months or sooner if clinically indicated. Patient advised to contact office with any questions, adverse effects, or acute worsening in signs and symptoms.   Victor [name] was seen today for follow-up.  Diagnoses and all orders for this visit:  Insomnia, unspecified type -     QUEtiapine (SEROQUEL) 50 MG tablet; Take 1-2 tabs po QHS prn insomnia  Social anxiety disorder -     sertraline (ZOLOFT) 100 MG tablet; Take 1.5 tablets (150 mg total) by mouth daily.  Generalized anxiety disorder -     sertraline (ZOLOFT) 100 MG tablet; Take 1.5 tablets (150 mg total) by mouth daily.     Please see After Visit Summary for patient specific instructions.  Future Appointments  Date Time Provider Department Center  01/10/2020 10:00 AM Pauline Good, LCSW [facility] None  01/17/2020 10:00 AM Pauline Good, LCSW [facility] None  01/24/2020  8:00 AM Pauline Good, LCSW [facility] None  01/31/2020 10:00 AM Pauline Good, LCSW [facility] None  02/07/2020  9:00 AM Pauline Good, LCSW [facility] None  02/14/2020  9:00 AM Pauline Good, LCSW [facility] None  03/13/2020 10:30 AM Corie Chiquito, PMHNP [facility] None    No orders of the defined types were placed in this encounter.   -------------------------------
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
## 2020-01-03 ENCOUNTER — Ambulatory Visit: Payer: Commercial Managed Care - PPO | Admitting: Addiction (Substance Use Disorder)

## 2020-01-10 ENCOUNTER — Encounter: Payer: Self-pay | Admitting: Addiction (Substance Use Disorder)

## 2020-01-10 ENCOUNTER — Other Ambulatory Visit: Payer: Self-pay

## 2020-01-10 ENCOUNTER — Ambulatory Visit (INDEPENDENT_AMBULATORY_CARE_PROVIDER_SITE_OTHER): Payer: Commercial Managed Care - PPO | Admitting: Addiction (Substance Use Disorder)

## 2020-01-10 DIAGNOSIS — F411 Generalized anxiety disorder: Secondary | ICD-10-CM

## 2020-01-10 DIAGNOSIS — F1021 Alcohol dependence, in remission: Secondary | ICD-10-CM | POA: Diagnosis not present
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
## 2020-01-17 ENCOUNTER — Ambulatory Visit (INDEPENDENT_AMBULATORY_CARE_PROVIDER_SITE_OTHER): Payer: Commercial Managed Care - PPO | Admitting: Addiction (Substance Use Disorder)

## 2020-01-17 ENCOUNTER — Other Ambulatory Visit: Payer: Self-pay

## 2020-01-17 ENCOUNTER — Encounter: Payer: Self-pay | Admitting: Addiction (Substance Use Disorder)

## 2020-01-17 DIAGNOSIS — F411 Generalized anxiety disorder: Secondary | ICD-10-CM | POA: Diagnosis not present

## 2020-01-17 DIAGNOSIS — F1021 Alcohol dependence, in remission: Secondary | ICD-10-CM
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
## 2020-01-20 ENCOUNTER — Encounter (HOSPITAL_COMMUNITY): Payer: Self-pay

## 2020-01-20 ENCOUNTER — Ambulatory Visit (HOSPITAL_COMMUNITY)
Admission: EM | Admit: 2020-01-20 | Discharge: 2020-01-20 | Disposition: A | Discharge status: Home / Self Care | Payer: Commercial Managed Care - PPO | Attending: Urgent Care | Admitting: Urgent Care

## 2020-01-20 ENCOUNTER — Other Ambulatory Visit: Payer: Self-pay

## 2020-01-20 ENCOUNTER — Ambulatory Visit (INDEPENDENT_AMBULATORY_CARE_PROVIDER_SITE_OTHER): Payer: Commercial Managed Care - PPO

## 2020-01-20 DIAGNOSIS — M79604 Pain in right leg: Secondary | ICD-10-CM

## 2020-01-20 DIAGNOSIS — M79661 Pain in right lower leg: Secondary | ICD-10-CM

## 2020-01-20 DIAGNOSIS — S80911A Unspecified superficial injury of right knee, initial encounter: Secondary | ICD-10-CM

## 2020-01-20 DIAGNOSIS — S8981XA Other specified injuries of right lower leg, initial encounter: Secondary | ICD-10-CM

## 2020-01-20 MED ORDER — NAPROXEN 500 MG PO TABS
500.0000 mg | ORAL_TABLET | Freq: Two times a day (BID) | ORAL | 0 refills | Status: DC
Start: 2020-01-20 — End: 2020-06-29

## 2020-01-20 NOTE — ED Provider Notes (Signed)
[facility]   MRN: [id number] DOB: Jan 19, 1991  Subjective:   Victor [name] is a 29 y.o. male presenting for 1 day hx of acute onset right leg pain.  Patient states that he was chasing after his dog who got loose.  He ended up trying to jump over a brick wall but landed with his right leg stuck in the middle of it while his body carried forward causing a hyperextension injury at the level of the knee.  Today, states that his right ankle right shin and right knee hurt, worst over the right knee.  He is able to walk and bear weight but has to limp.  Use Tylenol with some relief.  He is worried about fracture of his entire leg.  No current facility-administered medications for this encounter.  Current Outpatient Medications:  .  naltrexone (DEPADE) 50 MG tablet, Take 1 tablet (50 mg total) by mouth at bedtime., Disp: 90 tablet, Rfl: 1 .  omeprazole (PRILOSEC OTC) 20 MG tablet, Take 20 mg by mouth daily., Disp: , Rfl:  .  propranolol (INDERAL) 10 MG tablet, Take 1-2 tabs po BID prn anxiety (Patient not taking: Reported on 12/27/2019), Disp: 60 tablet, Rfl: 2 .  QUEtiapine (SEROQUEL) 50 MG tablet, Take 1-2 tabs po QHS prn insomnia, Disp: 180 tablet, Rfl: 0 .  sertraline (ZOLOFT) 100 MG tablet, Take 1.5 tablets (150 mg total) by mouth daily., Disp: 135 tablet, Rfl: 1   No Known Allergies  Past Medical History:  Diagnosis Date  . Acid reflux   . ADHD (attention deficit hyperactivity disorder)   . Anxiety   . Hiatal hernia   . Panic attack      Past Surgical History:  Procedure Laterality Date  . LAPAROSCOPIC APPENDECTOMY N/A 08/03/2015   Procedure: APPENDECTOMY LAPAROSCOPIC;  Surgeon: Manus Rudd, MD;  Location: [facility] OR;  Service: General;  Laterality: N/A;    Family History  Problem Relation Age of Onset  . Colon cancer Neg Hx   . Esophageal cancer Neg Hx   . Rectal cancer Neg Hx   . Stomach cancer Neg Hx     Social History   Tobacco Use  . Smoking status:  Current Every Day Smoker    Packs/day: 0.50    Types: Cigarettes    Last attempt to quit: 04/01/2015    Years since quitting: 4.8  . Smokeless tobacco: Never Used  Vaping Use  . Vaping Use: Former  Substance Use Topics  . Alcohol use: Yes    Alcohol/week: 13.0 - 20.0 standard drinks    Types: 12 - 18 Cans of beer, 1 - 2 Glasses of wine per week    Comment: daily  . Drug use: Not Currently    Types: Cocaine    Comment: occ    ROS   Objective:   Vitals: BP 117/76 (BP Location: Right Arm)   Pulse 66   Temp 98.6 F (37 C) (Oral)   Resp 18   SpO2 97%   Physical Exam Constitutional:      General: He is not in acute distress.    Appearance: Normal appearance. He is well-developed and normal weight. He is not ill-appearing, toxic-appearing or diaphoretic.  HENT:     Head: Normocephalic and atraumatic.     Right Ear: External ear normal.     Left Ear: External ear normal.     Nose: Nose normal.     Mouth/Throat:     Pharynx: Oropharynx is clear.  Eyes:     General: No scleral icterus.       Right eye: No discharge.        Left eye: No discharge.     Extraocular Movements: Extraocular movements intact.     Pupils: Pupils are equal, round, and reactive to light.  Cardiovascular:     Rate and Rhythm: Normal rate.  Pulmonary:     Effort: Pulmonary effort is normal.  Musculoskeletal:     Cervical back: Normal range of motion.     Right knee: No swelling, deformity, effusion, erythema, ecchymosis, lacerations, bony tenderness or crepitus. Normal range of motion. Tenderness present over the medial joint line and patellar tendon. No lateral joint line tenderness. Normal alignment and normal patellar mobility.     Right lower leg: Tenderness (over area outlined, abrasion as filled in) present. No deformity, lacerations or bony tenderness.     Right ankle: No swelling, deformity, ecchymosis or lacerations. No tenderness. Normal range of motion. Anterior drawer test negative.      Right Achilles Tendon: No tenderness or defects. Thompson's test negative.       Legs:  Neurological:     Mental Status: He is alert and oriented to person, place, and time.  Psychiatric:        Mood and Affect: Mood normal.        Behavior: Behavior normal.        Thought Content: Thought content normal.        Judgment: Judgment normal.    DG Knee Complete 4 Views Right  Result Date: 01/20/2020 CLINICAL DATA:  RIGHT knee, leg and ankle injury after hyperextending leg during a fall yesterday trying to catch his dog EXAM: RIGHT KNEE - COMPLETE 4+ VIEW COMPARISON:  None FINDINGS: Osseous mineralization normal. Joint spaces preserved. No fracture, dislocation, or bone destruction. No joint effusion. IMPRESSION: Normal exam. Electronically Signed   By: Ulyses Southward M.D.   On: 01/20/2020 10:03   Assessment and Plan :   PDMP not reviewed this encounter.  1. Right leg pain   2. Pain in right lower leg   3. Hyperextension injury of right knee, initial encounter     Imaging very reassuring. Reviewed this with patient. Recommended conservative management. Tdap updated within the past 5 years. Naproxen for pain and inflammation. Counseled patient on potential for adverse effects with medications prescribed/recommended today, ER and return-to-clinic precautions discussed, patient verbalized understanding.    Wallis Bamberg, PA-C 01/20/20 1035

## 2020-01-20 NOTE — ED Triage Notes (Signed)
Pt presents with right knee, leg, ankle injury after hyperextending his leg during a fall yesterday while trying to capture his dog.

## 2020-01-24 ENCOUNTER — Ambulatory Visit (INDEPENDENT_AMBULATORY_CARE_PROVIDER_SITE_OTHER): Payer: Commercial Managed Care - PPO | Admitting: Addiction (Substance Use Disorder)

## 2020-01-24 ENCOUNTER — Encounter: Payer: Self-pay | Admitting: Addiction (Substance Use Disorder)

## 2020-01-24 ENCOUNTER — Other Ambulatory Visit: Payer: Self-pay

## 2020-01-24 DIAGNOSIS — F411 Generalized anxiety disorder: Secondary | ICD-10-CM

## 2020-01-24 DIAGNOSIS — F1021 Alcohol dependence, in remission: Secondary | ICD-10-CM | POA: Diagnosis not present
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
## 2020-01-31 ENCOUNTER — Ambulatory Visit (INDEPENDENT_AMBULATORY_CARE_PROVIDER_SITE_OTHER): Payer: Commercial Managed Care - PPO | Admitting: Addiction (Substance Use Disorder)

## 2020-01-31 ENCOUNTER — Encounter: Payer: Self-pay | Admitting: Addiction (Substance Use Disorder)

## 2020-01-31 ENCOUNTER — Other Ambulatory Visit: Payer: Self-pay

## 2020-01-31 DIAGNOSIS — F411 Generalized anxiety disorder: Secondary | ICD-10-CM | POA: Diagnosis not present

## 2020-01-31 DIAGNOSIS — F1021 Alcohol dependence, in remission: Secondary | ICD-10-CM
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
## 2020-02-07 ENCOUNTER — Ambulatory Visit (INDEPENDENT_AMBULATORY_CARE_PROVIDER_SITE_OTHER): Payer: Commercial Managed Care - PPO | Admitting: Addiction (Substance Use Disorder)

## 2020-02-07 ENCOUNTER — Other Ambulatory Visit: Payer: Self-pay

## 2020-02-07 DIAGNOSIS — F1021 Alcohol dependence, in remission: Secondary | ICD-10-CM

## 2020-02-07 DIAGNOSIS — F411 Generalized anxiety disorder: Secondary | ICD-10-CM | POA: Diagnosis not present
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
## 2020-02-11 ENCOUNTER — Encounter: Payer: Self-pay | Admitting: Addiction (Substance Use Disorder)

## 2020-02-14 ENCOUNTER — Ambulatory Visit (INDEPENDENT_AMBULATORY_CARE_PROVIDER_SITE_OTHER): Payer: Commercial Managed Care - PPO | Admitting: Addiction (Substance Use Disorder)

## 2020-02-14 ENCOUNTER — Other Ambulatory Visit: Payer: Self-pay

## 2020-02-14 DIAGNOSIS — F4010 Social phobia, unspecified: Secondary | ICD-10-CM

## 2020-02-14 DIAGNOSIS — F1021 Alcohol dependence, in remission: Secondary | ICD-10-CM
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
## 2020-03-03 ENCOUNTER — Other Ambulatory Visit: Payer: Self-pay | Admitting: Gastroenterology

## 2020-03-06 ENCOUNTER — Ambulatory Visit (INDEPENDENT_AMBULATORY_CARE_PROVIDER_SITE_OTHER): Payer: Commercial Managed Care - PPO | Admitting: Addiction (Substance Use Disorder)

## 2020-03-06 ENCOUNTER — Other Ambulatory Visit: Payer: Self-pay

## 2020-03-06 ENCOUNTER — Encounter: Payer: Self-pay | Admitting: Addiction (Substance Use Disorder)

## 2020-03-06 DIAGNOSIS — F411 Generalized anxiety disorder: Secondary | ICD-10-CM | POA: Diagnosis not present
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
## 2020-03-13 ENCOUNTER — Ambulatory Visit (INDEPENDENT_AMBULATORY_CARE_PROVIDER_SITE_OTHER): Payer: Commercial Managed Care - PPO | Admitting: Psychiatry

## 2020-03-13 ENCOUNTER — Other Ambulatory Visit: Payer: Self-pay

## 2020-03-13 ENCOUNTER — Encounter: Payer: Self-pay | Admitting: Psychiatry

## 2020-03-13 DIAGNOSIS — F4010 Social phobia, unspecified: Secondary | ICD-10-CM | POA: Diagnosis not present

## 2020-03-13 DIAGNOSIS — F411 Generalized anxiety disorder: Secondary | ICD-10-CM

## 2020-03-13 DIAGNOSIS — G4700 Insomnia, unspecified: Secondary | ICD-10-CM | POA: Diagnosis not present

## 2020-03-13 DIAGNOSIS — F1021 Alcohol dependence, in remission: Secondary | ICD-10-CM | POA: Diagnosis not present

## 2020-03-13 MED ORDER — SERTRALINE HCL 100 MG PO TABS: 100.0000 mg | ORAL_TABLET | Freq: Every day | ORAL | 1 refills | Status: DC

## 2020-03-13 MED ORDER — QUETIAPINE FUMARATE 100 MG PO TABS: 100.0000 mg | ORAL_TABLET | Freq: Every day | ORAL | 1 refills | Status: DC

## 2020-03-13 MED ORDER — NALTREXONE HCL 50 MG PO TABS: 50.0000 mg | ORAL_TABLET | Freq: Every day | ORAL | 1 refills | Status: DC
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
## 2020-03-20 ENCOUNTER — Ambulatory Visit (INDEPENDENT_AMBULATORY_CARE_PROVIDER_SITE_OTHER): Payer: Commercial Managed Care - PPO | Admitting: Addiction (Substance Use Disorder)

## 2020-03-20 ENCOUNTER — Encounter: Payer: Self-pay | Admitting: Addiction (Substance Use Disorder)

## 2020-03-20 ENCOUNTER — Other Ambulatory Visit: Payer: Self-pay

## 2020-03-20 DIAGNOSIS — F1021 Alcohol dependence, in remission: Secondary | ICD-10-CM

## 2020-03-20 DIAGNOSIS — F411 Generalized anxiety disorder: Secondary | ICD-10-CM | POA: Diagnosis not present
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
## 2020-03-31 ENCOUNTER — Telehealth: Payer: Self-pay | Admitting: Psychiatry

## 2020-03-31 NOTE — Telephone Encounter (Signed)
Received refill request from pharmacy for Seroquel 50 mg tablets.  Response sent that prescription was sent in for 100 mg tabs on March 13, 2020.  Patient reported during visit on 03/13/2020 that he prefers 100 mg tabs.

## 2020-04-10 ENCOUNTER — Encounter: Payer: Self-pay | Admitting: Addiction (Substance Use Disorder)

## 2020-04-10 ENCOUNTER — Ambulatory Visit (INDEPENDENT_AMBULATORY_CARE_PROVIDER_SITE_OTHER): Payer: Commercial Managed Care - PPO | Admitting: Addiction (Substance Use Disorder)

## 2020-04-10 ENCOUNTER — Other Ambulatory Visit: Payer: Self-pay

## 2020-04-10 DIAGNOSIS — F411 Generalized anxiety disorder: Secondary | ICD-10-CM

## 2020-04-10 DIAGNOSIS — F1021 Alcohol dependence, in remission: Secondary | ICD-10-CM | POA: Diagnosis not present
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
## 2020-04-24 ENCOUNTER — Ambulatory Visit: Payer: Commercial Managed Care - PPO | Admitting: Addiction (Substance Use Disorder)

## 2020-05-06 ENCOUNTER — Telehealth: Payer: Self-pay | Admitting: Psychiatry

## 2020-05-06 DIAGNOSIS — G4700 Insomnia, unspecified: Secondary | ICD-10-CM

## 2020-05-06 DIAGNOSIS — F411 Generalized anxiety disorder: Secondary | ICD-10-CM

## 2020-05-06 DIAGNOSIS — F4010 Social phobia, unspecified: Secondary | ICD-10-CM

## 2020-05-06 MED ORDER — QUETIAPINE FUMARATE 100 MG PO TABS: 100.0000 mg | ORAL_TABLET | Freq: Every day | ORAL | 1 refills | Status: DC

## 2020-05-06 MED ORDER — SERTRALINE HCL 100 MG PO TABS: 100.0000 mg | ORAL_TABLET | Freq: Every day | ORAL | 1 refills | Status: DC

## 2020-05-06 NOTE — Telephone Encounter (Signed)
Received fax refill request from [facility] for Quetiapine and Sertraline. Scripts sent.

## 2020-05-08 ENCOUNTER — Ambulatory Visit (INDEPENDENT_AMBULATORY_CARE_PROVIDER_SITE_OTHER): Payer: Commercial Managed Care - PPO | Admitting: Addiction (Substance Use Disorder)

## 2020-05-08 ENCOUNTER — Other Ambulatory Visit: Payer: Self-pay

## 2020-05-08 DIAGNOSIS — F1020 Alcohol dependence, uncomplicated: Secondary | ICD-10-CM | POA: Diagnosis not present

## 2020-05-08 DIAGNOSIS — F411 Generalized anxiety disorder: Secondary | ICD-10-CM | POA: Diagnosis not present
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
## 2020-05-22 ENCOUNTER — Other Ambulatory Visit: Payer: Self-pay

## 2020-05-22 ENCOUNTER — Ambulatory Visit (INDEPENDENT_AMBULATORY_CARE_PROVIDER_SITE_OTHER): Payer: Commercial Managed Care - PPO | Admitting: Addiction (Substance Use Disorder)

## 2020-05-22 DIAGNOSIS — F1020 Alcohol dependence, uncomplicated: Secondary | ICD-10-CM | POA: Diagnosis not present

## 2020-05-22 DIAGNOSIS — F411 Generalized anxiety disorder: Secondary | ICD-10-CM
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
## 2020-06-05 ENCOUNTER — Encounter: Payer: Self-pay | Admitting: Psychiatry

## 2020-06-05 ENCOUNTER — Ambulatory Visit (INDEPENDENT_AMBULATORY_CARE_PROVIDER_SITE_OTHER): Payer: Commercial Managed Care - PPO | Admitting: Psychiatry

## 2020-06-05 ENCOUNTER — Other Ambulatory Visit: Payer: Self-pay

## 2020-06-05 ENCOUNTER — Ambulatory Visit (INDEPENDENT_AMBULATORY_CARE_PROVIDER_SITE_OTHER): Payer: Commercial Managed Care - PPO | Admitting: Addiction (Substance Use Disorder)

## 2020-06-05 DIAGNOSIS — F4010 Social phobia, unspecified: Secondary | ICD-10-CM

## 2020-06-05 DIAGNOSIS — F1021 Alcohol dependence, in remission: Secondary | ICD-10-CM | POA: Diagnosis not present

## 2020-06-05 DIAGNOSIS — F1020 Alcohol dependence, uncomplicated: Secondary | ICD-10-CM

## 2020-06-05 DIAGNOSIS — F411 Generalized anxiety disorder: Secondary | ICD-10-CM

## 2020-06-05 DIAGNOSIS — G4700 Insomnia, unspecified: Secondary | ICD-10-CM | POA: Diagnosis not present

## 2020-06-05 MED ORDER — NALTREXONE HCL 50 MG PO TABS: 50.0000 mg | ORAL_TABLET | Freq: Every day | ORAL | 1 refills | Status: DC

## 2020-06-05 MED ORDER — SERTRALINE HCL 100 MG PO TABS: 100.0000 mg | ORAL_TABLET | Freq: Every day | ORAL | 1 refills | Status: DC

## 2020-06-05 MED ORDER — QUETIAPINE FUMARATE 100 MG PO TABS: 100.0000 mg | ORAL_TABLET | Freq: Every day | ORAL | 1 refills | Status: DC

## 2020-06-05 NOTE — Progress Notes (Signed)
Victor [name] [id number] 1990/11/10 30 y.o.  Subjective:   Patient ID:  Victor [name] is a 30 y.o. (DOB Feb 02, 1991) male.  Chief Complaint:  Chief Complaint  Patient presents with  . Follow-up    Anxiety, depression, insomnia    HPI Victor [name] presents to the office today for follow-up of anxiety, depression, insomnia, and h/o ETOH dependence (in remission). He reports that he is continuing to notice some sexual side effects. He has been changing administration time of Sertraline to HS and this seems to help some. He reports that sexual side effects are manageable.  He reports that he has been "content."  Denies any recent anxiety. He denies any recent social anxiety and no longer feels anxious with going to [employer] Denies depressed mood. Sleeping well. He reports that he has difficulty sleeping if he does not take Seroquel. Appetite has been good. Energy and motivation have been good. Denies difficulty with concentration. Denies SI.   He is completing an apprenticeship for work.   Past Psychiatric Medication Trials: Propranolol- Somewhat effective Gabapentin-ineffective Adderall XR Adderall Vyvanse Xanax Hydroxyzine Trazodone- Ineffective Sertraline Seroquel- Ineffective at 50 mg dose. Helpful for sleep at 100 mg dose. Naltrexone Carbamazepine- prescribed for detox  PHQ2-9   Flowsheet Row Office Visit from 07/01/2015 in Primary Care at [facility] Total Score 0    Flowsheet Row ED from 10/04/2019 in [facility]EMERGENCY DEPT  C-SSRS RISK CATEGORY Low Risk       Review of Systems:  Review of Systems  Gastrointestinal:       Reports loose stools  Musculoskeletal: Negative for gait problem.  Neurological: Negative for tremors.  Psychiatric/Behavioral:       Please refer to HPI    Medications: I have reviewed the patient's current medications.  Current Outpatient Medications  Medication Sig Dispense Refill  . omeprazole (PRILOSEC OTC) 20  MG tablet Take 20 mg by mouth daily as needed.    . naltrexone (DEPADE) 50 MG tablet Take 1 tablet (50 mg total) by mouth at bedtime. 90 tablet 1  . naproxen (NAPROSYN) 500 MG tablet Take 1 tablet (500 mg total) by mouth 2 (two) times daily with a meal. (Patient not taking: Reported on 03/13/2020) 30 tablet 0  . QUEtiapine (SEROQUEL) 100 MG tablet Take 1 tablet (100 mg total) by mouth at bedtime. 90 tablet 1  . sertraline (ZOLOFT) 100 MG tablet Take 1 tablet (100 mg total) by mouth daily. 90 tablet 1   No current facility-administered medications for this visit.    Medication Side Effects: Other: Some sexual side effects  Allergies: No Known Allergies  Past Medical History:  Diagnosis Date  . Acid reflux   . ADHD (attention deficit hyperactivity disorder)   . Anxiety   . Hiatal hernia   . Panic attack     Family History  Problem Relation Age of Onset  . Colon cancer Neg Hx   . Esophageal cancer Neg Hx   . Rectal cancer Neg Hx   . Stomach cancer Neg Hx     Social History   Socioeconomic History  . Marital status: Single    Spouse name: n/a  . Number of children: 0  . Years of education: Associates  . Highest education level: Not on file  Occupational History  . Occupation: Chief Executive Officer  Tobacco Use  . Smoking status: Current Some Day Smoker    Packs/day: 0.50    Types: Cigarettes    Last attempt to  quit: 04/01/2015    Years since quitting: 5.1  . Smokeless tobacco: Never Used  Vaping Use  . Vaping Use: Every day  . Substances: Nicotine  Substance and Sexual Activity  . Alcohol use: Not Currently    Alcohol/week: 0.0 standard drinks    Comment: Last ETOH use July 2021  . Drug use: Not Currently    Types: Cocaine    Comment: occ  . Sexual activity: Not on file  Other Topics Concern  . Not on file  Social History Narrative   Lives with his girlfriend currently    Right handed   Caffeine: about 1 can soda/day   Social Determinants of Health    Financial Resource Strain: Not on file  Food Insecurity: Not on file  Transportation Needs: Not on file  Physical Activity: Not on file  Stress: Not on file  Social Connections: Not on file  Intimate Partner Violence: Not on file    Past Medical History, Surgical history, Social history, and Family history were reviewed and updated as appropriate.   Please see review of systems for further details on the patient's review from today.   Objective:   Physical Exam:  Wt 170 lb (77.1 kg)   BMI 25.85 kg/m   Physical Exam Constitutional:      General: He is not in acute distress. Musculoskeletal:        General: No deformity.  Neurological:     Mental Status: He is alert and oriented to person, place, and time.     Coordination: Coordination normal.  Psychiatric:        Attention and Perception: Attention and perception normal. He does not perceive auditory or visual hallucinations.        Mood and Affect: Mood normal. Mood is not anxious or depressed. Affect is not labile, blunt, angry or inappropriate.        Speech: Speech normal.        Behavior: Behavior normal.        Thought Content: Thought content normal. Thought content is not paranoid or delusional. Thought content does not include homicidal or suicidal ideation. Thought content does not include homicidal or suicidal plan.        Cognition and Memory: Cognition and memory normal.        Judgment: Judgment normal.     Comments: Insight intact     Lab Review:     Component Value Date/Time   NA 139 10/04/2019 1947   K 4.5 10/04/2019 1947   CL 101 10/04/2019 1947   CO2 22 10/04/2019 1947   GLUCOSE 99 10/04/2019 1947   BUN 11 10/04/2019 1947   CREATININE 0.93 10/04/2019 1947   CALCIUM 9.0 10/04/2019 1947   PROT 8.5 (H) 10/04/2019 1947   ALBUMIN 5.1 (H) 10/04/2019 1947   AST 53 (H) 10/04/2019 1947   ALT 55 (H) 10/04/2019 1947   ALKPHOS 59 10/04/2019 1947   BILITOT 0.6 10/04/2019 1947   GFRNONAA >60  10/04/2019 1947   GFRAA >60 10/04/2019 1947       Component Value Date/Time   WBC 6.5 10/04/2019 1947   RBC 5.25 10/04/2019 1947   HGB 16.1 10/04/2019 1947   HCT 46.0 10/04/2019 1947   PLT 241 10/04/2019 1947   MCV 87.6 10/04/2019 1947   MCH 30.7 10/04/2019 1947   MCHC 35.0 10/04/2019 1947   RDW 12.7 10/04/2019 1947   LYMPHSABS 2.4 06/06/2018 1726   MONOABS 0.5 06/06/2018 1726   EOSABS 0.2 06/06/2018 1726  BASOSABS 0.1 06/06/2018 1726    No results found for: POCLITH, LITHIUM   No results found for: PHENYTOIN, PHENOBARB, VALPROATE, CBMZ   .res Assessment: Plan:     He reports that benefits of medication are currently outweighing side effects. Advised pt to notify provider if side effects become intolerable and he would like to consider decrease or change in medication.  Continue Naltrexone 50 mg po QHS for ETOH cravings.  Continue Seroquel 100 mg po QHS for insomnia.  Continue Sertraline 100 mg po QHS for mood and anxiety s/s. Recommend continuing therapy with [name], LCSW.  Pt to follow-up in 6 months or sooner if clinically indicated.  Patient advised to contact office with any questions, adverse effects, or acute worsening in signs and symptoms.    Sylvestre was seen today for follow-up.  Diagnoses and all orders for this visit:  Alcohol dependence in early full remission (HCC) -     naltrexone (DEPADE) 50 MG tablet; Take 1 tablet (50 mg total) by mouth at bedtime.  Insomnia, unspecified type -     QUEtiapine (SEROQUEL) 100 MG tablet; Take 1 tablet (100 mg total) by mouth at bedtime.  Social anxiety disorder -     sertraline (ZOLOFT) 100 MG tablet; Take 1 tablet (100 mg total) by mouth daily.  Generalized anxiety disorder -     sertraline (ZOLOFT) 100 MG tablet; Take 1 tablet (100 mg total) by mouth daily.     Please see After Visit Summary for patient specific instructions.  Future Appointments  Date Time Provider Department Center  06/26/2020 11:00 AM  Pauline Good, LCSW [facility] None  07/10/2020 11:00 AM Pauline Good, LCSW [facility] None  07/24/2020 10:00 AM Pauline Good, LCSW [facility] None  08/07/2020 10:00 AM Pauline Good, LCSW [facility] None  08/21/2020 10:00 AM Pauline Good, LCSW [facility] None  12/04/2020 10:00 AM Corie Chiquito, PMHNP [facility] None    No orders of the defined types were placed in this encounter.   -------------------------------
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
## 2020-06-26 ENCOUNTER — Other Ambulatory Visit: Payer: Self-pay

## 2020-06-26 ENCOUNTER — Ambulatory Visit (INDEPENDENT_AMBULATORY_CARE_PROVIDER_SITE_OTHER): Payer: Commercial Managed Care - PPO | Admitting: Addiction (Substance Use Disorder)

## 2020-06-26 DIAGNOSIS — F411 Generalized anxiety disorder: Secondary | ICD-10-CM

## 2020-06-26 DIAGNOSIS — F1021 Alcohol dependence, in remission: Secondary | ICD-10-CM | POA: Diagnosis not present
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
## 2020-06-29 ENCOUNTER — Other Ambulatory Visit: Payer: Self-pay

## 2020-06-29 ENCOUNTER — Ambulatory Visit (AMBULATORY_SURGERY_CENTER): Payer: Self-pay | Admitting: *Deleted

## 2020-06-29 VITALS — Ht 68.0 in | Wt 171.0 lb

## 2020-06-29 DIAGNOSIS — K209 Esophagitis, unspecified without bleeding: Secondary | ICD-10-CM

## 2020-06-29 NOTE — Progress Notes (Signed)
No egg or soy allergy known to patient  No issues with past sedation with any surgeries or procedures Patient denies ever being told they had issues or difficulty with intubation  No FH of Malignant Hyperthermia No diet pills per patient No home 02 use per patient  No blood thinners per patient  Pt denies issues with constipation  No A fib or A flutter  EMMI video to pt or via MyChart  COVID 19 guidelines implemented in [facility] today with Pt and RN  Pt is fully vaccinated  for Covid    He has  bonded  Front right tooth, no capped front teeth- he does have bonded back teeth as well-  No loose teeth - has fillings   Due to the COVID-19 pandemic we are asking patients to follow certain guidelines.  Pt aware of COVID protocols and [facility] guidelines

## 2020-07-10 ENCOUNTER — Ambulatory Visit (INDEPENDENT_AMBULATORY_CARE_PROVIDER_SITE_OTHER): Payer: Commercial Managed Care - PPO | Admitting: Addiction (Substance Use Disorder)

## 2020-07-10 ENCOUNTER — Other Ambulatory Visit: Payer: Self-pay

## 2020-07-10 DIAGNOSIS — F411 Generalized anxiety disorder: Secondary | ICD-10-CM | POA: Diagnosis not present

## 2020-07-10 DIAGNOSIS — F1021 Alcohol dependence, in remission: Secondary | ICD-10-CM
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
## 2020-07-21 ENCOUNTER — Ambulatory Visit (AMBULATORY_SURGERY_CENTER): Payer: Commercial Managed Care - PPO | Admitting: Gastroenterology

## 2020-07-21 ENCOUNTER — Other Ambulatory Visit: Payer: Self-pay

## 2020-07-21 ENCOUNTER — Encounter: Payer: Self-pay | Admitting: Gastroenterology

## 2020-07-21 VITALS — BP 95/68 | HR 51 | Temp 98.0°F | Resp 13 | Ht 68.0 in | Wt 171.0 lb

## 2020-07-21 DIAGNOSIS — K2100 Gastro-esophageal reflux disease with esophagitis, without bleeding: Secondary | ICD-10-CM | POA: Diagnosis present

## 2020-07-21 DIAGNOSIS — K208 Other esophagitis without bleeding: Secondary | ICD-10-CM | POA: Diagnosis not present

## 2020-07-21 DIAGNOSIS — K209 Esophagitis, unspecified without bleeding: Secondary | ICD-10-CM

## 2020-07-21 MED ORDER — SODIUM CHLORIDE 0.9 % IV SOLN: 500.0000 mL | Freq: Once | INTRAVENOUS | Status: AC

## 2020-07-21 NOTE — Progress Notes (Signed)
1012 Robinul 0.1 mg IV given due large amount of secretions upon assessment.  MD made aware, vss 

## 2020-07-21 NOTE — Op Note (Signed)
[place] Patient Name: Victor [name] Procedure Date: 07/21/2020 10:11 AM MRN: [id number] Endoscopist: Justice Britain , MD Age: 30 Referring MD:  Date of Birth: 03/15/1991 Gender: Male Account #: [id number] Procedure:                Upper GI endoscopy Indications:              Surveillance procedure, Reflux esophagitis,                            Follow-up of reflux esophagitis Medicines:                Monitored Anesthesia Care Procedure:                Pre-Anesthesia Assessment:                           - Prior to the procedure, a History and Physical                            was performed, and patient medications and                            allergies were reviewed. The patient's tolerance of                            previous anesthesia was also reviewed. The risks                            and benefits of the procedure and the sedation                            options and risks were discussed with the patient.                            All questions were answered, and informed consent                            was obtained. Prior Anticoagulants: The patient has                            taken no previous anticoagulant or antiplatelet                            agents. ASA Grade Assessment: II - A patient with                            mild systemic disease. After reviewing the risks                            and benefits, the patient was deemed in                            satisfactory condition to undergo the procedure.  After obtaining informed consent, the endoscope was                            passed under direct vision. Throughout the                            procedure, the patient's blood pressure, pulse, and                            oxygen saturations were monitored continuously. The                            Endoscope was introduced through the mouth, and                            advanced to the second part  of duodenum. The upper                            GI endoscopy was accomplished without difficulty.                            The patient tolerated the procedure. Scope In: Scope Out: Findings:                 A single area of ectopic gastric mucosa was found                            in the proximal esophagus, 16 to 18 cm from the                            incisors.                           No gross lesions were noted in the mid esophagus.                           Two scattered islands of salmon-colored mucosa were                            present from 37 to 38 cm. No other visible                            abnormalities were present. Biopsies were taken                            with a cold forceps for histology.                           Previous esophagitis has healed.                           The Z-line was irregular and was found 38 cm from  the incisors.                           A 1 cm hiatal hernia was present.                           Striped mildly erythematous mucosa without bleeding                            was found in the gastric antrum.                           No other gross lesions were noted in the entire                            examined stomach. Biopsies were taken with a cold                            forceps for histology and Helicobacter pylori                            testing.                           No gross lesions were noted in the duodenal bulb,                            in the first portion of the duodenum and in the                            second portion of the duodenum. Complications:            No immediate complications. Estimated Blood Loss:     Estimated blood loss was minimal. Impression:               - Ectopic gastric mucosa in the proximal esophagus.                           - No gross lesions in the middle esophagus.                           - Salmon-colored mucosa suggestive of Barrett's                             esophagus in distal esophagus. Biopsied.                           - Previous esophagitis has healed.                           - Z-line irregular, 38 cm from the incisors.                           - 1 cm hiatal hernia.                           -  Erythematous mucosa in the antrum. No other gross                            lesions in the stomach. Biopsied.                           - No gross lesions in the duodenal bulb, in the                            first portion of the duodenum and in the second                            portion of the duodenum. Recommendation:           - The patient will be observed post-procedure,                            until all discharge criteria are met.                           - Discharge patient to home.                           - Patient has a contact number available for                            emergencies. The signs and symptoms of potential                            delayed complications were discussed with the                            patient. Return to normal activities tomorrow.                            Written discharge instructions were provided to the                            patient.                           - Resume previous diet.                           - Continue present medications. Use of PPI therapy                            in setting of previous esophagitis is recommended,                            but if symptomatically doing well then PRN use is                            reasonable.                           -  Await pathology results.                           - Repeat upper endoscopy for surveillance based on                            pathology results if evidence of Barrett's is found                            otherwise PRN.                           - The findings and recommendations were discussed                            with the patient.                           - The findings  and recommendations were discussed                            with the patient's family. Justice Britain, MD 07/21/2020 10:32:31 AM

## 2020-07-21 NOTE — Progress Notes (Signed)
Called to room to assist during endoscopic procedure.  Patient ID and intended procedure confirmed with present staff. Received instructions for my participation in the procedure from the performing physician.  

## 2020-07-21 NOTE — Patient Instructions (Signed)
YOU HAD AN ENDOSCOPIC PROCEDURE TODAY AT THE [facility]:   Refer to the procedure report that was given to you for any specific questions about what was found during the examination.  If the procedure report does not answer your questions, please call your gastroenterologist to clarify.  If you requested that your care partner not be given the details of your procedure findings, then the procedure report has been included in a sealed envelope for you to review at your convenience later.  YOU SHOULD EXPECT: Some feelings of bloating in the abdomen. Passage of more gas than usual.  Walking can help get rid of the air that was put into your GI tract during the procedure and reduce the bloating. If you had a lower endoscopy (such as a colonoscopy or flexible sigmoidoscopy) you may notice spotting of blood in your stool or on the toilet paper. If you underwent a bowel prep for your procedure, you may not have a normal bowel movement for a few days.  Please Note:  You might notice some irritation and congestion in your nose or some drainage.  This is from the oxygen used during your procedure.  There is no need for concern and it should clear up in a day or so.  SYMPTOMS TO REPORT IMMEDIATELY:    Following upper endoscopy (EGD)  Vomiting of blood or coffee ground material  New chest pain or pain under the shoulder blades  Painful or persistently difficult swallowing  New shortness of breath  Fever of 100F or higher  Black, tarry-looking stools  For urgent or emergent issues, a gastroenterologist can be reached at any hour by calling [phone]. Do not use MyChart messaging for urgent concerns.    DIET:  We do recommend a small meal at first, but then you may proceed to your regular diet.  Drink plenty of fluids but you should avoid alcoholic beverages for 24 hours.  ACTIVITY:  You should plan to take it easy for the rest of today and you should NOT DRIVE or use heavy machinery  until tomorrow (because of the sedation medicines used during the test).    FOLLOW UP: Our staff will call the number listed on your records 48-72 hours following your procedure to check on you and address any questions or concerns that you may have regarding the information given to you following your procedure. If we do not reach you, we will leave a message.  We will attempt to reach you two times.  During this call, we will ask if you have developed any symptoms of COVID 19. If you develop any symptoms (ie: fever, flu-like symptoms, shortness of breath, cough etc.) before then, please call [phone].  If you test positive for Covid 19 in the 2 weeks post procedure, please call and report this information to us.    If any biopsies were taken you will be contacted by phone or by letter within the next 1-3 weeks.  Please call us at [phone] if you have not heard about the biopsies in 3 weeks.    SIGNATURES/CONFIDENTIALITY: You and/or your care partner have signed paperwork which will be entered into your electronic medical record.  These signatures attest to the fact that that the information above on your After Visit Summary has been reviewed and is understood.  Full responsibility of the confidentiality of this discharge information lies with you and/or your care-partner.    Resume medications. Information given on Hiatal Hernia. 

## 2020-07-21 NOTE — Progress Notes (Signed)
Vs by CW; previsit with EM.  No changes to health hx since previsit 

## 2020-07-21 NOTE — Progress Notes (Signed)
Report given to PACU, vss 

## 2020-07-23 ENCOUNTER — Telehealth: Payer: Self-pay | Admitting: *Deleted

## 2020-07-23 ENCOUNTER — Telehealth: Payer: Self-pay

## 2020-07-23 NOTE — Telephone Encounter (Signed)
Left message on follow up call. 

## 2020-07-23 NOTE — Telephone Encounter (Signed)
  Follow up Call-  Call back number 07/21/2020 06/14/2018  Post procedure Call Back phone  # [phone]  Permission to leave phone message Yes Yes  Some recent data might be hidden     Patient questions:  Do you have a fever, pain , or abdominal swelling? No. Pain Score  0 *  Have you tolerated food without any problems? Yes.    Have you been able to return to your normal activities? Yes.    Do you have any questions about your discharge instructions: Diet   No. Medications  No. Follow up visit  No.  Do you have questions or concerns about your Care? No.  Actions: * If pain score is 4 or above: No action needed, pain <4.  1. Have you developed a fever since your procedure? no  2.   Have you had an respiratory symptoms (SOB or cough) since your procedure? no  3.   Have you tested positive for COVID 19 since your procedure no  4.   Have you had any family members/close contacts diagnosed with the COVID 19 since your procedure?  no   If yes to any of these questions please route to Laverna Peace, RN and Karlton Lemon, RN

## 2020-07-24 ENCOUNTER — Ambulatory Visit: Payer: Commercial Managed Care - PPO | Admitting: Addiction (Substance Use Disorder)

## 2020-07-27 ENCOUNTER — Encounter: Payer: Self-pay | Admitting: Gastroenterology

## 2020-08-07 ENCOUNTER — Ambulatory Visit: Payer: Commercial Managed Care - PPO | Admitting: Addiction (Substance Use Disorder)

## 2020-08-21 ENCOUNTER — Other Ambulatory Visit: Payer: Self-pay

## 2020-08-21 ENCOUNTER — Ambulatory Visit (INDEPENDENT_AMBULATORY_CARE_PROVIDER_SITE_OTHER): Payer: Commercial Managed Care - PPO | Admitting: Addiction (Substance Use Disorder)

## 2020-08-21 DIAGNOSIS — F411 Generalized anxiety disorder: Secondary | ICD-10-CM

## 2020-08-21 DIAGNOSIS — F1021 Alcohol dependence, in remission: Secondary | ICD-10-CM | POA: Diagnosis not present
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
## 2020-09-18 ENCOUNTER — Other Ambulatory Visit: Payer: Self-pay

## 2020-09-18 ENCOUNTER — Ambulatory Visit (INDEPENDENT_AMBULATORY_CARE_PROVIDER_SITE_OTHER): Payer: Commercial Managed Care - PPO | Admitting: Addiction (Substance Use Disorder)

## 2020-09-18 DIAGNOSIS — F411 Generalized anxiety disorder: Secondary | ICD-10-CM

## 2020-09-18 DIAGNOSIS — F1021 Alcohol dependence, in remission: Secondary | ICD-10-CM
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
## 2020-10-16 ENCOUNTER — Ambulatory Visit: Payer: Commercial Managed Care - PPO | Admitting: Addiction (Substance Use Disorder)

## 2020-10-23 ENCOUNTER — Other Ambulatory Visit: Payer: Self-pay

## 2020-10-23 DIAGNOSIS — G4700 Insomnia, unspecified: Secondary | ICD-10-CM

## 2020-10-23 MED ORDER — QUETIAPINE FUMARATE 100 MG PO TABS: 100.0000 mg | ORAL_TABLET | Freq: Every day | ORAL | 0 refills | Status: DC

## 2020-10-26 ENCOUNTER — Ambulatory Visit: Payer: Commercial Managed Care - PPO | Admitting: Addiction (Substance Use Disorder)

## 2020-11-17 ENCOUNTER — Other Ambulatory Visit: Payer: Self-pay | Admitting: Psychiatry

## 2020-11-17 DIAGNOSIS — F1021 Alcohol dependence, in remission: Secondary | ICD-10-CM

## 2020-11-19 NOTE — Telephone Encounter (Signed)
Last filled 6/3

## 2020-11-27 ENCOUNTER — Other Ambulatory Visit: Payer: Self-pay

## 2020-11-27 ENCOUNTER — Ambulatory Visit: Payer: Commercial Managed Care - PPO | Admitting: Addiction (Substance Use Disorder)

## 2020-11-27 DIAGNOSIS — F411 Generalized anxiety disorder: Secondary | ICD-10-CM | POA: Diagnosis not present

## 2020-11-27 DIAGNOSIS — F422 Mixed obsessional thoughts and acts: Secondary | ICD-10-CM
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
## 2020-12-04 ENCOUNTER — Other Ambulatory Visit: Payer: Self-pay

## 2020-12-04 ENCOUNTER — Ambulatory Visit (INDEPENDENT_AMBULATORY_CARE_PROVIDER_SITE_OTHER): Payer: Commercial Managed Care - PPO | Admitting: Psychiatry

## 2020-12-04 ENCOUNTER — Encounter: Payer: Self-pay | Admitting: Psychiatry

## 2020-12-04 DIAGNOSIS — F411 Generalized anxiety disorder: Secondary | ICD-10-CM

## 2020-12-04 DIAGNOSIS — F4010 Social phobia, unspecified: Secondary | ICD-10-CM

## 2020-12-04 DIAGNOSIS — F1021 Alcohol dependence, in remission: Secondary | ICD-10-CM

## 2020-12-04 DIAGNOSIS — G4700 Insomnia, unspecified: Secondary | ICD-10-CM | POA: Diagnosis not present

## 2020-12-04 MED ORDER — QUETIAPINE FUMARATE 100 MG PO TABS: 100.0000 mg | ORAL_TABLET | Freq: Every day | ORAL | 1 refills | Status: DC

## 2020-12-04 MED ORDER — SERTRALINE HCL 50 MG PO TABS: 50.0000 mg | ORAL_TABLET | Freq: Every day | ORAL | 1 refills | Status: DC

## 2020-12-04 MED ORDER — NALTREXONE HCL 50 MG PO TABS: 50.0000 mg | ORAL_TABLET | Freq: Every day | ORAL | 1 refills | Status: DC

## 2020-12-04 NOTE — Progress Notes (Signed)
   12/04/20 1029  Facial and Oral Movements  Muscles of Facial Expression 0  Lips and Perioral Area 0  Jaw 0  Tongue 0  Extremity Movements  Upper (arms, wrists, hands, fingers) 0  Lower (legs, knees, ankles, toes) 0  Trunk Movements  Neck, shoulders, hips 0  Overall Severity  Severity of abnormal movements (highest score from questions above) 0  Incapacitation due to abnormal movements 0  Patient's awareness of abnormal movements (rate only patient's report) 0  AIMS Total Score  AIMS Total Score 0

## 2020-12-04 NOTE — Progress Notes (Signed)
Victor [name] [id number] 02-29-1991 30 y.o.  Subjective:   Patient ID:  Victor [name] is a 30 y.o. (DOB 17-Aug-1990) male.  Chief Complaint:  Chief Complaint  Patient presents with   Follow-up    [name], depression, and h/o ETOH dependence    HPI Victor [name]. Had a year's sobriety in July. Denies cravings to drink. Victor [name] reports some work related [name]. Has accepted a new position with increased responsibility and stress. Victor [name] reports that [name] has been manageable. Denies panic attacks. Denies excessive worry and anxious thoughts. Denies depressed mood. Victor [name] reports occasional irritability in response to feeling overwhelmed. Victor [name] reports that Victor [name] makes a conscious effort to get 7.5 hours of sleep a night. Appetite has been good. Energy is low at the end of the day. Reports that Victor [name] "has a lot of energy" during the day. Motivation has been good. Concentration has been good. Denies SI.   Has a supportive girlfriend. Goes to meetings 1-2 times a week. Just bought a house in [place].   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   Has decreased Sertraline to 50 mg due to sexual side effects at 100 mg. Reports sexual side effects are now manageable.   Continues to see [name], LCSW for therapy.   Past Psychiatric Medication Trials: Propranolol- Somewhat effective Gabapentin-ineffective Adderall XR Adderall Vyvanse Xanax Hydroxyzine Trazodone- Ineffective Sertraline Seroquel- Ineffective at 50 mg dose. Helpful for sleep at 100 mg dose. Naltrexone Carbamazepine- prescribed for detox  AIMS    Flowsheet Row Office Visit from 12/04/2020 in [facility]  AIMS Total Score 0      PHQ2-9    Flowsheet Row Office Visit from 07/01/2015 in [facility] at [facility] Total Score 0      Flowsheet Row ED from 10/04/2019 in [facility]  C-SSRS RISK CATEGORY Low Risk        Review of Systems:  Review of Systems  Gastrointestinal: Negative.   Musculoskeletal:  Negative for gait problem.  Neurological:  Negative for tremors.  Psychiatric/Behavioral:         Please refer to HPI   Medications: I have reviewed the patient's current medications.  Current Outpatient Medications  Medication Sig Dispense Refill   famotidine (PEPCID) 20 MG tablet Take 20 mg by mouth 2 (two) times daily as needed for heartburn or indigestion.     naltrexone (DEPADE) 50 MG tablet Take 1 tablet (50 mg total) by mouth at bedtime. 90 tablet 1   QUEtiapine (SEROQUEL) 100 MG tablet Take 1 tablet (100 mg total) by mouth at bedtime. 90 tablet 1   sertraline (ZOLOFT) 50 MG tablet Take 1 tablet (50 mg total) by mouth daily. 90 tablet 1   Current Facility-Administered Medications  Medication Dose Route Frequency Provider Last Rate Last Admin   0.9 %  sodium chloride infusion  500 mL Intravenous Once Mansouraty, Netty Starring., MD        Medication Side Effects: Other: Mild sexual effects  Allergies: No Known Allergies  Past Medical History:  Diagnosis Date   Acid reflux    ADHD (attention deficit hyperactivity disorder)    Allergy    mild    [name]    Hiatal hernia    Hypertension     past hx - no meds    Neuromuscular disorder (HCC)    HH   Panic attack  Seizures (HCC)    x 1- 09-2019- ETOH withdrawal caused seizure- none since     Past Medical History, Surgical history, Social history, and Family history were reviewed and updated as appropriate.   Please see review of systems for further details on the patient's review from today.   Objective:   Physical Exam:  There were no vitals taken for this visit.  Physical Exam Constitutional:      General: Victor [name] is not in acute distress. Musculoskeletal:        General: No deformity.  Neurological:     Mental Status: Victor [name] is alert and oriented to person, place, and time.      Coordination: Coordination normal.  Psychiatric:        Attention and Perception: Attention and perception normal. Victor [name] does not perceive auditory or visual hallucinations.        Mood and Affect: Mood normal. Mood is not anxious or depressed. Affect is not labile, blunt, angry or inappropriate.        Speech: Speech normal.        Behavior: Behavior normal.        Thought Content: Thought content normal. Thought content is not paranoid or delusional. Thought content does not include homicidal or suicidal ideation. Thought content does not include homicidal or suicidal plan.        Cognition and Memory: Cognition and memory normal.        Judgment: Judgment normal.     Comments: Insight intact    Lab Review:     Component Value Date/Time   NA 139 10/04/2019 1947   K 4.5 10/04/2019 1947   CL 101 10/04/2019 1947   CO2 22 10/04/2019 1947   GLUCOSE 99 10/04/2019 1947   BUN 11 10/04/2019 1947   CREATININE 0.93 10/04/2019 1947   CALCIUM 9.0 10/04/2019 1947   PROT 8.5 (H) 10/04/2019 1947   ALBUMIN 5.1 (H) 10/04/2019 1947   AST 53 (H) 10/04/2019 1947   ALT 55 (H) 10/04/2019 1947   ALKPHOS 59 10/04/2019 1947   BILITOT 0.6 10/04/2019 1947   GFRNONAA >60 10/04/2019 1947   GFRAA >60 10/04/2019 1947       Component Value Date/Time   WBC 6.5 10/04/2019 1947   RBC 5.25 10/04/2019 1947   HGB 16.1 10/04/2019 1947   HCT 46.0 10/04/2019 1947   PLT 241 10/04/2019 1947   MCV 87.6 10/04/2019 1947   MCH 30.7 10/04/2019 1947   MCHC 35.0 10/04/2019 1947   RDW 12.7 10/04/2019 1947   LYMPHSABS 2.4 06/06/2018 1726   MONOABS 0.5 06/06/2018 1726   EOSABS 0.2 06/06/2018 1726   BASOSABS 0.1 06/06/2018 1726    No results found for: POCLITH, LITHIUM   No results found for: PHENYTOIN, PHENOBARB, VALPROATE, CBMZ   .res Assessment: Plan:   Pt seen for 30 minutes and time spent discussing his concerns about long term use of Naltrexone. Will continue Naltrexone 50 mg po QHS for ETOH dependence in  remission.  Continue Seroquel 100 mg po QHS for off-label indication of insomnia. Continue Sertraline 50 mg po qd for [name] and mood.  Recommend continuing therapy with [name], LCSW.  Pt to follow-up in 6 months or sooner if clinically indicated.  Patient advised to contact office with any questions, adverse effects, or acute worsening in signs and symptoms.  Victor [name] was seen today for follow-up.  Diagnoses and all orders for this visit:  Alcohol dependence in early full remission (HCC) -  naltrexone (DEPADE) 50 MG tablet; Take 1 tablet (50 mg total) by mouth at bedtime.  Insomnia, unspecified type -     QUEtiapine (SEROQUEL) 100 MG tablet; Take 1 tablet (100 mg total) by mouth at bedtime.  Social [name] disorder -     sertraline (ZOLOFT) 50 MG tablet; Take 1 tablet (50 mg total) by mouth daily.  Generalized [name] disorder -     sertraline (ZOLOFT) 50 MG tablet; Take 1 tablet (50 mg total) by mouth daily.    Please see After Visit Summary for patient specific instructions.  Future Appointments  Date Time Provider Department Center  01/01/2021 11:00 AM Pauline Good, [place] [facility] None  01/29/2021 10:00 AM Pauline Good, LCSW [facility] None  03/04/2021 10:00 AM Pauline Good, LCSW [facility] None  04/09/2021 11:00 AM Pauline Good, LCSW [facility] None  06/04/2021 10:00 AM Corie Chiquito, PMHNP [facility] None    No orders of the defined types were placed in this encounter.   -------------------------------

## 2021-01-01 ENCOUNTER — Ambulatory Visit: Payer: Commercial Managed Care - PPO | Admitting: Addiction (Substance Use Disorder)

## 2021-01-29 ENCOUNTER — Ambulatory Visit (INDEPENDENT_AMBULATORY_CARE_PROVIDER_SITE_OTHER): Payer: Commercial Managed Care - PPO | Admitting: Addiction (Substance Use Disorder)

## 2021-01-29 ENCOUNTER — Other Ambulatory Visit: Payer: Self-pay

## 2021-01-29 DIAGNOSIS — F411 Generalized anxiety disorder: Secondary | ICD-10-CM

## 2021-01-29 DIAGNOSIS — F1021 Alcohol dependence, in remission: Secondary | ICD-10-CM
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
## 2021-03-04 ENCOUNTER — Ambulatory Visit: Payer: Commercial Managed Care - PPO | Admitting: Addiction (Substance Use Disorder)

## 2021-03-04 ENCOUNTER — Other Ambulatory Visit: Payer: Self-pay

## 2021-03-04 DIAGNOSIS — F1021 Alcohol dependence, in remission: Secondary | ICD-10-CM

## 2021-03-04 DIAGNOSIS — F330 Major depressive disorder, recurrent, mild: Secondary | ICD-10-CM
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
## 2021-04-09 ENCOUNTER — Ambulatory Visit: Payer: Commercial Managed Care - PPO | Admitting: Addiction (Substance Use Disorder)

## 2021-04-28 ENCOUNTER — Other Ambulatory Visit: Payer: Self-pay | Admitting: Psychiatry

## 2021-04-28 DIAGNOSIS — F411 Generalized anxiety disorder: Secondary | ICD-10-CM

## 2021-04-28 DIAGNOSIS — F4010 Social phobia, unspecified: Secondary | ICD-10-CM

## 2021-05-04 ENCOUNTER — Ambulatory Visit: Payer: Commercial Managed Care - PPO | Admitting: Addiction (Substance Use Disorder)

## 2021-05-27 ENCOUNTER — Ambulatory Visit: Payer: Commercial Managed Care - PPO | Admitting: Addiction (Substance Use Disorder)

## 2021-05-27 ENCOUNTER — Other Ambulatory Visit: Payer: Self-pay

## 2021-05-27 DIAGNOSIS — F331 Major depressive disorder, recurrent, moderate: Secondary | ICD-10-CM | POA: Diagnosis not present

## 2021-05-27 DIAGNOSIS — F1021 Alcohol dependence, in remission: Secondary | ICD-10-CM
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
## 2021-06-04 ENCOUNTER — Other Ambulatory Visit: Payer: Self-pay

## 2021-06-04 ENCOUNTER — Ambulatory Visit: Payer: Commercial Managed Care - PPO | Admitting: Addiction (Substance Use Disorder)

## 2021-06-04 ENCOUNTER — Ambulatory Visit (INDEPENDENT_AMBULATORY_CARE_PROVIDER_SITE_OTHER): Payer: Commercial Managed Care - PPO | Admitting: Psychiatry

## 2021-06-04 ENCOUNTER — Encounter: Payer: Self-pay | Admitting: Psychiatry

## 2021-06-04 DIAGNOSIS — F4010 Social phobia, unspecified: Secondary | ICD-10-CM | POA: Diagnosis not present

## 2021-06-04 DIAGNOSIS — G4700 Insomnia, unspecified: Secondary | ICD-10-CM

## 2021-06-04 DIAGNOSIS — F411 Generalized anxiety disorder: Secondary | ICD-10-CM

## 2021-06-04 DIAGNOSIS — F1021 Alcohol dependence, in remission: Secondary | ICD-10-CM | POA: Diagnosis not present

## 2021-06-04 MED ORDER — QUETIAPINE FUMARATE 100 MG PO TABS: 100.0000 mg | ORAL_TABLET | Freq: Every day | ORAL | 1 refills | Status: DC

## 2021-06-04 MED ORDER — SERTRALINE HCL 50 MG PO TABS: 50.0000 mg | ORAL_TABLET | Freq: Every day | ORAL | 1 refills | Status: DC

## 2021-06-04 MED ORDER — NALTREXONE HCL 50 MG PO TABS: 50.0000 mg | ORAL_TABLET | Freq: Every day | ORAL | 1 refills | Status: DC

## 2021-06-04 NOTE — Progress Notes (Signed)
Prathik Aman ?[id number] ?10/31/1990 ?31 y.o. ? ?Subjective:  ? ?Patient ID:  Victor [name] is a 31 y.o. (DOB 1991-02-22) male. ? ?Chief Complaint:  ?Chief Complaint  ?Patient presents with  ? Follow-up  ?  Anxiety, depression, and insomnia  ? ? ?HPI ?Victor [name] presents to the office today for follow-up of depression, anxiety, and insomnia. He reports that his anxiety has been well controlled. Denies any anxiety attacks. Denies depressed mood. Sleeping well with Seroquel. He goes to bed around 9-9:30 pm and wakes up around 5 am. Appetite has been good. He reports that he has gained 5-10 lbs and attributes this to eating on the go. Energy and motivation have been good. Denies concentration difficulties and has been able to multi-task. Denies SI. ? ?He reports that he has not had any ETOH cravings. Going to [employer] weekly on Wednesdays. Denies relapse.  ? 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 Relationship is going well. ? ?Seeing [name], LCAS, LCSW.   ? ?Past Psychiatric Medication Trials: ?Propranolol- Somewhat effective ?Gabapentin-ineffective ?Adderall XR ?Adderall ?Vyvanse ?Xanax ?Hydroxyzine ?Trazodone- Ineffective ?Sertraline ?Seroquel- Ineffective at 50 mg dose. Helpful for sleep at 100 mg dose. ?Naltrexone ?Carbamazepine- prescribed for detox ? ?AIMS   ? ?Flowsheet Row Office Visit from 06/04/2021 in [facility] Office Visit from 12/04/2020 in [facility]  ?AIMS Total Score 0 0  ? ?  ? ?PHQ2-9   ? ?Flowsheet Row Office Visit from 07/01/2015 in [facility] at [facility]  ?PHQ-2 Total Score 0  ? ?  ? ?Flowsheet Row ED from 10/04/2019 in [facility]  ?C-SSRS RISK CATEGORY Low Risk  ? ?  ?  ? ?Review of Systems:  ?Review of Systems  ?Gastrointestinal: Negative.   ?Musculoskeletal:  Negative for gait problem.  ?Neurological:  Negative for  tremors.  ?Psychiatric/Behavioral:    ?     Please refer to HPI  ? ?Medications: I have reviewed the patient's current medications. ? ?Current Outpatient Medications  ?Medication Sig Dispense Refill  ? famotidine (PEPCID) 20 MG tablet Take 20 mg by mouth 2 (two) times daily as needed for heartburn or indigestion.    ? naltrexone (DEPADE) 50 MG tablet Take 1 tablet (50 mg total) by mouth at bedtime. 90 tablet 1  ? QUEtiapine (SEROQUEL) 100 MG tablet Take 1 tablet (100 mg total) by mouth at bedtime. 90 tablet 1  ? sertraline (ZOLOFT) 50 MG tablet Take 1 tablet (50 mg total) by mouth daily. 90 tablet 1  ? ?Current Facility-Administered Medications  ?Medication Dose Route Frequency Provider Last Rate Last Admin  ? 0.9 %  sodium chloride infusion  500 mL Intravenous Once Mansouraty, Netty Starring., MD      ? ? ?Medication Side Effects: None ? ?Allergies: No Known Allergies ? ?Past Medical History:  ?Diagnosis Date  ? Acid reflux   ? ADHD (attention deficit hyperactivity disorder)   ? Allergy   ? mild   ? Anxiety   ? Hiatal hernia   ? Hypertension   ?  past hx - no meds   ? Neuromuscular disorder (HCC)   ? HH  ? Panic attack   ? Seizures (HCC)   ? x 1- 09-2019- ETOH withdrawal caused seizure- none since   ? ? ?Past Medical History, Surgical history, Social history, and Family history were reviewed and updated as appropriate.  ? ?Please see review of systems for  further details on the patient's review from today.  ? ?Objective:  ? ?Physical Exam:  ?There were no vitals taken for this visit. ? ?Physical Exam ?Constitutional:   ?   General: He is not in acute distress. ?Musculoskeletal:     ?   General: No deformity.  ?Neurological:  ?   Mental Status: He is alert and oriented to person, place, and time.  ?   Coordination: Coordination normal.  ?Psychiatric:     ?   Attention and Perception: Attention and perception normal. He does not perceive auditory or visual hallucinations.     ?   Mood and Affect: Mood normal. Mood is not  anxious or depressed. Affect is not labile, blunt, angry or inappropriate.     ?   Speech: Speech normal.     ?   Behavior: Behavior normal.     ?   Thought Content: Thought content normal. Thought content is not paranoid or delusional. Thought content does not include homicidal or suicidal ideation. Thought content does not include homicidal or suicidal plan.     ?   Cognition and Memory: Cognition and memory normal.     ?   Judgment: Judgment normal.  ?   Comments: Insight intact  ? ? ?Lab Review:  ?   ?Component Value Date/Time  ? NA 139 10/04/2019 1947  ? K 4.5 10/04/2019 1947  ? CL 101 10/04/2019 1947  ? CO2 22 10/04/2019 1947  ? GLUCOSE 99 10/04/2019 1947  ? BUN 11 10/04/2019 1947  ? CREATININE 0.93 10/04/2019 1947  ? CALCIUM 9.0 10/04/2019 1947  ? PROT 8.5 (H) 10/04/2019 1947  ? ALBUMIN 5.1 (H) 10/04/2019 1947  ? AST 53 (H) 10/04/2019 1947  ? ALT 55 (H) 10/04/2019 1947  ? ALKPHOS 59 10/04/2019 1947  ? BILITOT 0.6 10/04/2019 1947  ? GFRNONAA >60 10/04/2019 1947  ? GFRAA >60 10/04/2019 1947  ? ? ?   ?Component Value Date/Time  ? WBC 6.5 10/04/2019 1947  ? RBC 5.25 10/04/2019 1947  ? HGB 16.1 10/04/2019 1947  ? HCT 46.0 10/04/2019 1947  ? PLT 241 10/04/2019 1947  ? MCV 87.6 10/04/2019 1947  ? MCH 30.7 10/04/2019 1947  ? MCHC 35.0 10/04/2019 1947  ? RDW 12.7 10/04/2019 1947  ? LYMPHSABS 2.4 06/06/2018 1726  ? MONOABS 0.5 06/06/2018 1726  ? EOSABS 0.2 06/06/2018 1726  ? BASOSABS 0.1 06/06/2018 1726  ? ? ?No results found for: POCLITH, LITHIUM  ? ?No results found for: PHENYTOIN, PHENOBARB, VALPROATE, CBMZ  ? ?.res ?Assessment: Plan:   ?Pt seen for 30 minutes and time spent discussing long-term side effects of Seroquel to include potential risk for metabolic side effects and tardive dyskinesia. He reports that benefits are outweighing side effects at this time. Pt reports that he will have labs drawn at work and will bring a copy to the office.  ?Continue Sertraline 50 mg po qd for anxiety and mood.  ?Continue  Seroquel 100 mg po QHS.  ?Continue Naltrexone 50 mg po QHS for ETOH cravings.  ?Recommend continuing therapy with [name], LCSW.  ?Pt to follow-up in 6 months or sooner if clinically indicated.  ?Patient advised to contact office with any questions, adverse effects, or acute worsening in signs and symptoms. ? ? ?Victor [name] was seen today for follow-up. ? ?Diagnoses and all orders for this visit: ? ?Alcohol dependence in early full remission (HCC) ?-     naltrexone (DEPADE) 50 MG tablet; Take 1 tablet (50 mg  total) by mouth at bedtime. ? ?Insomnia, unspecified type ?-     QUEtiapine (SEROQUEL) 100 MG tablet; Take 1 tablet (100 mg total) by mouth at bedtime. ? ?Social anxiety disorder ?-     sertraline (ZOLOFT) 50 MG tablet; Take 1 tablet (50 mg total) by mouth daily. ? ?Generalized anxiety disorder ?-     sertraline (ZOLOFT) 50 MG tablet; Take 1 tablet (50 mg total) by mouth daily. ? ?  ? ?Please see After Visit Summary for patient specific instructions. ? ?Future Appointments  ?Date Time Provider Department Center  ?06/24/2021  9:00 AM Pauline Good, LCSW [facility] None  ?08/26/2021  3:00 PM Pauline Good, LCSW [facility] None  ?12/03/2021 10:00 AM Corie Chiquito, PMHNP [facility] None  ? ? ?No orders of the defined types were placed in this encounter. ? ? ?------------------------------- ?

## 2021-06-24 ENCOUNTER — Ambulatory Visit: Payer: Commercial Managed Care - PPO | Admitting: Addiction (Substance Use Disorder)

## 2021-07-26 ENCOUNTER — Telehealth: Payer: Self-pay | Admitting: Psychiatry

## 2021-07-26 DIAGNOSIS — F4010 Social phobia, unspecified: Secondary | ICD-10-CM

## 2021-07-26 DIAGNOSIS — F411 Generalized anxiety disorder: Secondary | ICD-10-CM

## 2021-07-26 NOTE — Telephone Encounter (Signed)
ES said patient not eligible for refill until 5/9 based on 90-day supply the previous refill. Patient said he can't find his medication, which was why he was trying to get it refilled. He said he just came back from vacation an may have lost his bottle. He will look thru his luggage tonight and will let us know if he needs a refill to local pharmacy.  ?

## 2021-07-26 NOTE — Telephone Encounter (Signed)
Tamika called this morning at 11:00 to request that the prescription for Sertraline 50mg  at [facility] be released for processing.  He indicated that he had been taking 100mg  and was breaking in half so he didn't need to prescription filled right away, and that said to let her know when he needed the 50mg  prescription filled and she would release it.  [facility] and only can release it. ?

## 2021-07-27 NOTE — Telephone Encounter (Signed)
Pt called reporting he can not find his Sertraline. Requesting a Rx to local  [facility] [address] [place]   ?

## 2021-07-28 MED ORDER — SERTRALINE HCL 50 MG PO TABS: 50.0000 mg | ORAL_TABLET | Freq: Every day | ORAL | 0 refills | Status: DC

## 2021-07-28 NOTE — Telephone Encounter (Signed)
Rx sent to requested pharmacy

## 2021-08-19 ENCOUNTER — Other Ambulatory Visit: Payer: Self-pay | Admitting: Psychiatry

## 2021-08-19 DIAGNOSIS — F4010 Social phobia, unspecified: Secondary | ICD-10-CM

## 2021-08-19 DIAGNOSIS — F411 Generalized anxiety disorder: Secondary | ICD-10-CM

## 2021-08-26 ENCOUNTER — Ambulatory Visit (INDEPENDENT_AMBULATORY_CARE_PROVIDER_SITE_OTHER): Payer: Commercial Managed Care - PPO | Admitting: Addiction (Substance Use Disorder)

## 2021-08-26 DIAGNOSIS — F411 Generalized anxiety disorder: Secondary | ICD-10-CM
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
## 2021-10-28 ENCOUNTER — Ambulatory Visit: Payer: Commercial Managed Care - PPO | Admitting: Addiction (Substance Use Disorder)

## 2021-11-01 ENCOUNTER — Ambulatory Visit (INDEPENDENT_AMBULATORY_CARE_PROVIDER_SITE_OTHER): Payer: Commercial Managed Care - PPO | Admitting: Addiction (Substance Use Disorder)

## 2021-11-01 DIAGNOSIS — F411 Generalized anxiety disorder: Secondary | ICD-10-CM | POA: Diagnosis not present

## 2021-11-01 DIAGNOSIS — F17293 Nicotine dependence, other tobacco product, with withdrawal: Secondary | ICD-10-CM | POA: Diagnosis not present
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
## 2021-11-23 ENCOUNTER — Ambulatory Visit (INDEPENDENT_AMBULATORY_CARE_PROVIDER_SITE_OTHER): Payer: Commercial Managed Care - PPO | Admitting: Psychiatry

## 2021-11-23 ENCOUNTER — Encounter: Payer: Self-pay | Admitting: Psychiatry

## 2021-11-23 DIAGNOSIS — F411 Generalized anxiety disorder: Secondary | ICD-10-CM | POA: Diagnosis not present

## 2021-11-23 DIAGNOSIS — F4010 Social phobia, unspecified: Secondary | ICD-10-CM | POA: Diagnosis not present

## 2021-11-23 DIAGNOSIS — F1021 Alcohol dependence, in remission: Secondary | ICD-10-CM | POA: Diagnosis not present

## 2021-11-23 DIAGNOSIS — G4700 Insomnia, unspecified: Secondary | ICD-10-CM | POA: Diagnosis not present

## 2021-11-23 MED ORDER — SERTRALINE HCL 50 MG PO TABS: 75.0000 mg | ORAL_TABLET | Freq: Every day | ORAL | 1 refills | Status: DC

## 2021-11-23 MED ORDER — BUSPIRONE HCL 15 MG PO TABS: ORAL_TABLET | ORAL | 1 refills | Status: DC

## 2021-11-23 MED ORDER — NALTREXONE HCL 50 MG PO TABS: 50.0000 mg | ORAL_TABLET | Freq: Every day | ORAL | 1 refills | Status: DC

## 2021-11-23 MED ORDER — QUETIAPINE FUMARATE 100 MG PO TABS: 100.0000 mg | ORAL_TABLET | Freq: Every day | ORAL | 1 refills | Status: DC

## 2021-11-23 NOTE — Progress Notes (Signed)
Victor [name] [id number] 03-Dec-1990 31 y.o.  Subjective:   Patient ID:  Victor [name] is a 31 y.o. (DOB 10-05-1990) male.  Chief Complaint:  Chief Complaint  Patient presents with   Anxiety    HPI Victor [name] presents to the office today for follow-up of anxiety.  He had a slight panic attack recently where he had to pull over on the road in response to increased stressors. He reports that leading up to this he noticed he was more stressed and had an outburst. He reports that "little things have really been getting under my skin." He reports being easily frustrated. Concerned about how other people's behavior reflects on him. Has had increased worry. He notices some social anxiety, such as when he has to confront employees. Denies depressed mood. Tries to get in bed at 9 and falls asleep around 10 pm and sleeps until 5 am.  Energy has been ok. Motivation has been good. Appetite has been good. He has been on a diet plan and ordering meals. Concentration is ok. Denies SI.   He reports that he sought mental health services through his work and they prescribed Alprazolam. He reports that he has stopped taking it in the last 2 weeks.  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 He took 2 days off and this was helpful. He also has upcoming vacation.   Working >70 hours a week. Planning a wedding on May 4. He plans to take a 2 week cruise. Remaining active in AA and trying to go meetings at least weekly. He and fiance plan to buy a house together and he will rent out his current house.   Denies any cravings to drink or relapses.   Past Psychiatric Medication Trials: Propranolol- Somewhat effective Gabapentin-ineffective Adderall XR Adderall Vyvanse Xanax Hydroxyzine Trazodone- Ineffective Sertraline Seroquel- Ineffective at 50 mg dose. Helpful for sleep at 100 mg dose. Naltrexone Carbamazepine- prescribed for  detox  AIMS    Flowsheet Row Office Visit from 06/04/2021 in [facility] Office Visit from 12/04/2020 in [facility]  AIMS Total Score 0 0      PHQ2-9    Flowsheet Row Office Visit from 07/01/2015 in Primary Care at [facility] Total Score 0      Flowsheet Row ED from 10/04/2019 in [facility]  C-SSRS RISK CATEGORY Low Risk        Review of Systems:  Review of Systems  Gastrointestinal:        Improved reflux with dietary change  Musculoskeletal:  Negative for gait problem.  Neurological:  Negative for tremors.  Psychiatric/Behavioral:         Please refer to HPI    Medications: I have reviewed the patient's current medications.  Current Outpatient Medications  Medication Sig Dispense Refill   busPIRone (BUSPAR) 15 MG tablet Take 1/3 tablet p.o. twice daily for 1 week, then take 2/3 tablet p.o. twice daily for 1 week, then take 1 tablet p.o. twice daily 60 tablet 1   alprazolam (XANAX) 2 MG tablet Take 2 mg by mouth daily as needed.     famotidine (PEPCID) 20 MG tablet Take 20 mg by mouth 2 (two) times daily as needed for heartburn or indigestion. (Patient not taking: Reported on 11/23/2021)     naltrexone (DEPADE) 50 MG tablet Take 1 tablet (50 mg total) by mouth at bedtime. 90 tablet 1   QUEtiapine (  SEROQUEL) 100 MG tablet Take 1 tablet (100 mg total) by mouth at bedtime. 90 tablet 1   sertraline (ZOLOFT) 50 MG tablet Take 1.5 tablets (75 mg total) by mouth daily. 135 tablet 1   Current Facility-Administered Medications  Medication Dose Route Frequency Provider Last Rate Last Admin   0.9 %  sodium chloride infusion  500 mL Intravenous Once Mansouraty, Netty Starring., MD        Medication Side Effects: None  Allergies: No Known Allergies  Past Medical History:  Diagnosis Date   Acid reflux    ADHD (attention deficit hyperactivity disorder)    Allergy    mild    Anxiety    Hiatal hernia     Hypertension     past hx - no meds    Neuromuscular disorder (HCC)    HH   Panic attack    Seizures (HCC)    x 1- 09-2019- ETOH withdrawal caused seizure- none since     Past Medical History, Surgical history, Social history, and Family history were reviewed and updated as appropriate.   Please see review of systems for further details on the patient's review from today.   Objective:   Physical Exam:  Wt 165 lb (74.8 kg)   BMI 25.09 kg/m   Physical Exam Constitutional:      General: He is not in acute distress. Musculoskeletal:        General: No deformity.  Neurological:     Mental Status: He is alert and oriented to person, place, and time.     Coordination: Coordination normal.  Psychiatric:        Attention and Perception: Attention and perception normal. He does not perceive auditory or visual hallucinations.        Mood and Affect: Mood is anxious. Mood is not depressed. Affect is not labile, blunt, angry or inappropriate.        Speech: Speech normal.        Behavior: Behavior normal.        Thought Content: Thought content normal. Thought content is not paranoid or delusional. Thought content does not include homicidal or suicidal ideation. Thought content does not include homicidal or suicidal plan.        Cognition and Memory: Cognition and memory normal.        Judgment: Judgment normal.     Comments: Insight intact     Lab Review:     Component Value Date/Time   NA 139 10/04/2019 1947   K 4.5 10/04/2019 1947   CL 101 10/04/2019 1947   CO2 22 10/04/2019 1947   GLUCOSE 99 10/04/2019 1947   BUN 11 10/04/2019 1947   CREATININE 0.93 10/04/2019 1947   CALCIUM 9.0 10/04/2019 1947   PROT 8.5 (H) 10/04/2019 1947   ALBUMIN 5.1 (H) 10/04/2019 1947   AST 53 (H) 10/04/2019 1947   ALT 55 (H) 10/04/2019 1947   ALKPHOS 59 10/04/2019 1947   BILITOT 0.6 10/04/2019 1947   GFRNONAA >60 10/04/2019 1947   GFRAA >60 10/04/2019 1947       Component Value Date/Time    WBC 6.5 10/04/2019 1947   RBC 5.25 10/04/2019 1947   HGB 16.1 10/04/2019 1947   HCT 46.0 10/04/2019 1947   PLT 241 10/04/2019 1947   MCV 87.6 10/04/2019 1947   MCH 30.7 10/04/2019 1947   MCHC 35.0 10/04/2019 1947   RDW 12.7 10/04/2019 1947   LYMPHSABS 2.4 06/06/2018 1726   MONOABS 0.5 06/06/2018 1726  EOSABS 0.2 06/06/2018 1726   BASOSABS 0.1 06/06/2018 1726    No results found for: "POCLITH", "LITHIUM"   No results found for: "PHENYTOIN", "PHENOBARB", "VALPROATE", "CBMZ"   .res Assessment: Plan:   Pt seen for 30 minutes and time spent discussing recent increase in anxiety. Discussed that he may be experiencing increased anxiety with lower dose of Sertraline. He reports that he continued to have sexual side effects with Sertraline 100 mg daily and sexual side effects resolved with decrease to 50 mg daily. Discussed option to increase Sertraline to 75 mg daily to determine if this dose may be helpful for anxiety without causing significant side effects. Discussed potential benefits, risks, and side effects of BuSpar.  He reports that he would prefer to start with trial of increase in Sertraline to 75 mg daily. Discussed that script for Buspar would be sent to pharmacy to be put on file in case he would like to start Buspar for anxiety in the event he is unable to tolerate Sertraline 75 mg po qd and/or needs additional improvement in anxiety management.  Continue Seroquel 100 mg po QHS for insomnia and anxiety.  Continue Naltrexone 50 mg po QHS for cravings.  Advised pt to use lowest possible effective dose of Alprazolam prescribed by another provider due to potential risks of dependence. He verbalized understanding.  Recommend continuing therapy with [name], LCSW.  Pt to follow-up in 6 months or sooner if clinically indicated.  Patient advised to contact office with any questions, adverse effects, or acute worsening in signs and symptoms.  Victor [name] was seen today for  anxiety.  Diagnoses and all orders for this visit:  Social anxiety disorder -     busPIRone (BUSPAR) 15 MG tablet; Take 1/3 tablet p.o. twice daily for 1 week, then take 2/3 tablet p.o. twice daily for 1 week, then take 1 tablet p.o. twice daily -     sertraline (ZOLOFT) 50 MG tablet; Take 1.5 tablets (75 mg total) by mouth daily.  Generalized anxiety disorder -     busPIRone (BUSPAR) 15 MG tablet; Take 1/3 tablet p.o. twice daily for 1 week, then take 2/3 tablet p.o. twice daily for 1 week, then take 1 tablet p.o. twice daily -     sertraline (ZOLOFT) 50 MG tablet; Take 1.5 tablets (75 mg total) by mouth daily.  Insomnia, unspecified type -     QUEtiapine (SEROQUEL) 100 MG tablet; Take 1 tablet (100 mg total) by mouth at bedtime.  Alcohol dependence in early full remission (HCC) -     naltrexone (DEPADE) 50 MG tablet; Take 1 tablet (50 mg total) by mouth at bedtime.     Please see After Visit Summary for patient specific instructions.  Future Appointments  Date Time Provider Department Center  01/12/2022  2:00 PM Pauline Good, [place] [facility] None  02/10/2022  3:00 PM Pauline Good, LCSW CP[facility] None  05/27/2022  1:30 PM Corie Chiquito, PMHNP CP[facility]CP None    No orders of the defined types were placed in this encounter.   -------------------------------

## 2021-12-03 ENCOUNTER — Ambulatory Visit: Payer: Commercial Managed Care - PPO | Admitting: Psychiatry

## 2022-01-12 ENCOUNTER — Ambulatory Visit (INDEPENDENT_AMBULATORY_CARE_PROVIDER_SITE_OTHER): Payer: Commercial Managed Care - PPO | Admitting: Addiction (Substance Use Disorder)

## 2022-01-12 DIAGNOSIS — F411 Generalized anxiety disorder: Secondary | ICD-10-CM
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
## 2022-02-10 ENCOUNTER — Ambulatory Visit: Payer: Commercial Managed Care - PPO | Admitting: Addiction (Substance Use Disorder)

## 2022-03-09 ENCOUNTER — Telehealth: Payer: Self-pay | Admitting: Psychiatry

## 2022-03-09 DIAGNOSIS — G4700 Insomnia, unspecified: Secondary | ICD-10-CM

## 2022-03-09 MED ORDER — QUETIAPINE FUMARATE 100 MG PO TABS: 100.0000 mg | ORAL_TABLET | Freq: Every day | ORAL | 1 refills | Status: DC

## 2022-03-09 NOTE — Telephone Encounter (Signed)
Pt has [employer] a new health plan. He needs all Rxs to go to [facility] as of 03/04/2022. P- [phone] F [phone] Please send in a Seroquel RF to [facility] for him at this time. Apt 2/23

## 2022-03-10 NOTE — Telephone Encounter (Signed)
Ok to send as requested

## 2022-05-27 ENCOUNTER — Encounter: Payer: Self-pay | Admitting: Psychiatry

## 2022-05-27 ENCOUNTER — Ambulatory Visit (INDEPENDENT_AMBULATORY_CARE_PROVIDER_SITE_OTHER): Payer: 59 | Admitting: Psychiatry

## 2022-05-27 DIAGNOSIS — G4700 Insomnia, unspecified: Secondary | ICD-10-CM | POA: Diagnosis not present

## 2022-05-27 DIAGNOSIS — F1021 Alcohol dependence, in remission: Secondary | ICD-10-CM

## 2022-05-27 DIAGNOSIS — F4010 Social phobia, unspecified: Secondary | ICD-10-CM

## 2022-05-27 DIAGNOSIS — F411 Generalized anxiety disorder: Secondary | ICD-10-CM | POA: Diagnosis not present

## 2022-05-27 MED ORDER — QUETIAPINE FUMARATE 100 MG PO TABS: 100.0000 mg | ORAL_TABLET | Freq: Every day | ORAL | 1 refills | Status: DC

## 2022-05-27 MED ORDER — NALTREXONE HCL 50 MG PO TABS: 50.0000 mg | ORAL_TABLET | Freq: Every day | ORAL | 1 refills | Status: DC

## 2022-05-27 MED ORDER — SERTRALINE HCL 50 MG PO TABS: 50.0000 mg | ORAL_TABLET | Freq: Every day | ORAL | 1 refills | Status: DC

## 2022-05-27 NOTE — Progress Notes (Unsigned)
Amaury Mascioli [id number] [birth year] 32 y.o.  Subjective:   Patient ID:  Victor [name] is a 32 y.o. (DOB 06/12/1990) male.  Chief Complaint:  Chief Complaint  Patient presents with   Follow-up    Depression, anxiety    HPI Arsen Lanes presents to the office today for follow-up of anxiety, depression, insomnia, and history of alcohol dependence.   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 He reports that this has been less stressful. Now working 45 hours a week instead of 70+. Planning for wedding in May. In the process of moving in with his fiance and looking for tenants for his house.    Anxiety has been manageable. Denies depressed mood. Denies panic attacks. Sleeping well. Energy and motivation have been good. Appetite has been good. He has been trying to cook more and eat out less. Concentration has been good. He reports that his fiance says he is somewhat forgetful. He repots that he uses [employer] calendar for all types of events. He reports that he is memorizing different types of electrical codes with his new job. Denies SI.   Had a panic attack a couple of months ago. He reports that a [facility] prescriber has prescribed Xanax 2 mg po qd prn.   Continues to go to AA every 1-2 weeks.   Brother has been in rehab for ETOH dependence. Has been trying to help support him and their mother.   Past Psychiatric Medication Trials: Propranolol- Somewhat effective Gabapentin-ineffective Adderall XR Adderall Vyvanse Xanax Hydroxyzine Trazodone- Ineffective Sertraline Seroquel- Ineffective at 50 mg dose. Helpful for sleep at 100 mg dose. Naltrexone Carbamazepine- prescribed for detox  [place] Office Visit from 05/27/2022 in [place] Office Visit from 06/04/2021 in [facility] Office Visit from 12/04/2020 in [place] Total Score 0 0 0      PHQ2-9     [place] Office Visit from 07/01/2015 in Primary Care at [facility] Total Score [place] ED from 10/04/2019 in [facility] Emergency Department at [place]        Review of Systems:  Review of Systems  Gastrointestinal: Negative.   Musculoskeletal:  Negative for gait problem.  Neurological:  Negative for tremors.  Psychiatric/Behavioral:         Please refer to HPI    Medications: I have reviewed the patient's current medications.  Current Outpatient Medications  Medication Sig Dispense Refill   alprazolam (XANAX) 2 MG tablet Take 2 mg by mouth daily as needed.     naltrexone (DEPADE) 50 MG tablet Take 1 tablet (50 mg total) by mouth at bedtime. 90 tablet 1   QUEtiapine (SEROQUEL) 100 MG tablet Take 1 tablet (100 mg total) by mouth at bedtime. 90 tablet 1   famotidine (PEPCID) 20 MG tablet Take 20 mg by mouth 2 (two) times daily as needed for heartburn or indigestion. (Patient not taking: Reported on 11/23/2021)     sertraline (ZOLOFT) 50 MG tablet Take 1.5 tablets (75 mg total) by mouth daily. (Patient taking differently: Take 50 mg by mouth daily.) 135 tablet 1   Current Facility-Administered Medications  Medication Dose Route Frequency Provider Last Rate Last Admin   0.9 %  sodium chloride infusion  500 mL Intravenous Once Mansouraty, Telford Nab., MD  Medication Side Effects: Other: Improved sexual side effects  Allergies: No Known Allergies  Past Medical History:  Diagnosis Date   Acid reflux    ADHD (attention deficit hyperactivity disorder)    Allergy    mild    Anxiety    Hiatal hernia    Hypertension     past hx - no meds    Neuromuscular disorder (HCC)    HH   Panic attack    Seizures ([place])    x 1- 09-2019- ETOH withdrawal caused seizure- none since     Past Medical History, Surgical history, Social history, and Family history were reviewed and updated as appropriate.   Please see  review of systems for further details on the patient's review from today.   Objective:   Physical Exam:  There were no vitals taken for this visit.  Physical Exam  He reports that pre-employment screening labs were WNL.  12/08/21- Hgb A1C was 5.9. Liver enzymes were WNL.   Lab Review:     Component Value Date/Time   NA 139 10/04/2019 1947   K 4.5 10/04/2019 1947   CL 101 10/04/2019 1947   CO2 22 10/04/2019 1947   GLUCOSE 99 10/04/2019 1947   BUN 11 10/04/2019 1947   CREATININE 0.93 10/04/2019 1947   CALCIUM 9.0 10/04/2019 1947   PROT 8.5 (H) 10/04/2019 1947   ALBUMIN 5.1 (H) 10/04/2019 1947   AST 53 (H) 10/04/2019 1947   ALT 55 (H) 10/04/2019 1947   ALKPHOS 59 10/04/2019 1947   BILITOT 0.6 10/04/2019 1947   GFRNONAA >60 10/04/2019 1947   GFRAA >60 10/04/2019 1947       Component Value Date/Time   WBC 6.5 10/04/2019 1947   RBC 5.25 10/04/2019 1947   HGB 16.1 10/04/2019 1947   HCT 46.0 10/04/2019 1947   PLT 241 10/04/2019 1947   MCV 87.6 10/04/2019 1947   MCH 30.7 10/04/2019 1947   MCHC 35.0 10/04/2019 1947   RDW 12.7 10/04/2019 1947   LYMPHSABS 2.4 06/06/2018 1726   MONOABS 0.5 06/06/2018 1726   EOSABS 0.2 06/06/2018 1726   BASOSABS 0.1 06/06/2018 1726    No results found for: "POCLITH", "LITHIUM"   No results found for: "PHENYTOIN", "PHENOBARB", "VALPROATE", "CBMZ"   .res Assessment: Plan:    Gunnar was seen today for follow-up.  Diagnoses and all orders for this visit:  Generalized anxiety disorder  Social anxiety disorder     Please see After Visit Summary for patient specific instructions.  No future appointments.   No orders of the defined types were placed in this encounter.   -------------------------------

## 2022-06-06 ENCOUNTER — Other Ambulatory Visit: Payer: Self-pay

## 2022-06-06 ENCOUNTER — Telehealth: Payer: Self-pay | Admitting: Psychiatry

## 2022-06-06 DIAGNOSIS — F1021 Alcohol dependence, in remission: Secondary | ICD-10-CM

## 2022-06-06 MED ORDER — NALTREXONE HCL 50 MG PO TABS: 50.0000 mg | ORAL_TABLET | Freq: Every day | ORAL | 0 refills | Status: DC

## 2022-06-06 NOTE — Telephone Encounter (Signed)
Pt called reporting [facility] back order for Naltrexone. Pt requesting 30 day Rx to [facility].

## 2022-06-06 NOTE — Telephone Encounter (Signed)
Sent!

## 2022-06-13 ENCOUNTER — Encounter: Payer: Self-pay | Admitting: Addiction (Substance Use Disorder)

## 2022-06-14 ENCOUNTER — Encounter: Payer: Self-pay | Admitting: Addiction (Substance Use Disorder)

## 2022-06-16 ENCOUNTER — Other Ambulatory Visit: Payer: Self-pay | Admitting: Psychiatry

## 2022-06-16 DIAGNOSIS — F1021 Alcohol dependence, in remission: Secondary | ICD-10-CM

## 2022-07-08 ENCOUNTER — Telehealth: Payer: Self-pay | Admitting: Psychiatry

## 2022-07-08 DIAGNOSIS — F1021 Alcohol dependence, in remission: Secondary | ICD-10-CM

## 2022-07-08 MED ORDER — NALTREXONE HCL 50 MG PO TABS: 50.0000 mg | ORAL_TABLET | Freq: Every day | ORAL | 0 refills | Status: DC

## 2022-07-08 NOTE — Telephone Encounter (Signed)
Pt called at 2:08p.  He would like the Naltrexone sent to [facility] [address], in [place].  He also asked if he could have 90 day supply.  Next appt 8/23

## 2022-07-08 NOTE — Telephone Encounter (Signed)
A 90-day supply sent to requested pharmacy.

## 2022-10-05 ENCOUNTER — Other Ambulatory Visit: Payer: Self-pay | Admitting: Psychiatry

## 2022-10-05 DIAGNOSIS — F1021 Alcohol dependence, in remission: Secondary | ICD-10-CM

## 2022-10-19 ENCOUNTER — Other Ambulatory Visit: Payer: Self-pay | Admitting: Psychiatry

## 2022-10-19 DIAGNOSIS — F4010 Social phobia, unspecified: Secondary | ICD-10-CM

## 2022-10-19 DIAGNOSIS — F411 Generalized anxiety disorder: Secondary | ICD-10-CM

## 2022-11-25 ENCOUNTER — Encounter: Payer: Self-pay | Admitting: Psychiatry

## 2022-11-25 ENCOUNTER — Ambulatory Visit: Payer: 59 | Admitting: Psychiatry

## 2022-11-25 DIAGNOSIS — F3342 Major depressive disorder, recurrent, in full remission: Secondary | ICD-10-CM

## 2022-11-25 DIAGNOSIS — F1021 Alcohol dependence, in remission: Secondary | ICD-10-CM

## 2022-11-25 DIAGNOSIS — G4700 Insomnia, unspecified: Secondary | ICD-10-CM

## 2022-11-25 DIAGNOSIS — F411 Generalized anxiety disorder: Secondary | ICD-10-CM | POA: Diagnosis not present

## 2022-11-25 MED ORDER — NALTREXONE HCL 50 MG PO TABS: ORAL_TABLET | ORAL | Status: DC

## 2022-11-25 MED ORDER — QUETIAPINE FUMARATE 25 MG PO TABS: ORAL_TABLET | ORAL | 2 refills | Status: DC

## 2022-11-25 NOTE — Progress Notes (Signed)
[place] [id number] September 15, 1990 32 y.o.  Subjective:   Patient ID:  Victor [name] is a 32 y.o. (DOB Nov 15, 1990) male.  Chief Complaint:  Chief Complaint  Patient presents with   Follow-up    Anxiety, depression, and insomnia    HPI Victor [name] presents to the office today for follow-up of anxiety, depression, and insomnia. He reports that his anxiety has been "a lot better." He denies any recent panic attacks. Denies worry or anxious thoughts. He denies anxiety in social situations. He reports adequate sleep. He reports that he has decreased Seroquel to 50 mg a couple of months ago and would like to come off medications. He reports that he has a strict bedtime routine and has anxiety about not being able to sleep. Energy and motivation have been good. Appetite has been good. He reports that his wife cooks healthy foods. Denies difficulty with concentration. Denies SI.   He has gotten married and moved since last visit. He started current job in November.   He reports that he has not taken Xanax prn recently. Denies ETOH use in the last 3 years. He report that he has not been going to meetings recently. 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   Past Psychiatric Medication Trials: Propranolol- Somewhat effective Gabapentin-ineffective Adderall XR Adderall Vyvanse Xanax Hydroxyzine Trazodone- Ineffective Sertraline Seroquel- Ineffective at 50 mg dose. Helpful for sleep at 100 mg dose. Naltrexone Carbamazepine- prescribed for detox  AIMS    Flowsheet Row Office Visit from 11/25/2022 in [facility] Office Visit from 05/27/2022 in [facility] Office Visit from 06/04/2021 in [facility] Office Visit from 12/04/2020 in [facility]  AIMS Total Score 0 0 0 0      PHQ2-9    Flowsheet Row Office Visit from 07/01/2015 in  Primary Care at [facility] Total Score 0      Flowsheet Row ED from 10/04/2019 in [facility] Emergency Department at [facility]  C-SSRS RISK CATEGORY Low Risk        Review of Systems:  Review of Systems  Gastrointestinal:        Denies any recent reflux  Musculoskeletal:  Negative for gait problem.  Neurological:  Negative for tremors.  Psychiatric/Behavioral:         Please refer to HPI    Medications: I have reviewed the patient's current medications.  Current Outpatient Medications  Medication Sig Dispense Refill   Multiple Vitamins-Minerals (ONE DAILY MULTIVITAMIN MEN PO) Take by mouth.     sertraline (ZOLOFT) 50 MG tablet TAKE 1 TABLET BY MOUTH DAILY 90 tablet 3   naltrexone (DEPADE) 50 MG tablet Take 1/2 tablet daily until current supply is completed, then stop     QUEtiapine (SEROQUEL) 25 MG tablet Take 1-2 tablets at bedtime 180 tablet 2   Current Facility-Administered Medications  Medication Dose Route Frequency Provider Last Rate Last Admin   0.9 %  sodium chloride infusion  500 mL Intravenous Once Mansouraty, Netty Starring., MD        Medication Side Effects: None  Allergies: No Known Allergies  Past Medical History:  Diagnosis Date   Acid reflux    ADHD (attention deficit hyperactivity disorder)    Allergy    mild    Anxiety    Hiatal hernia    Hypertension     past hx - no meds  Neuromuscular disorder (HCC)    HH   Panic attack    Seizures (HCC)    x 1- 09-2019- ETOH withdrawal caused seizure- none since     Past Medical History, Surgical history, Social history, and Family history were reviewed and updated as appropriate.   Please see review of systems for further details on the patient's review from today.   Objective:   Physical Exam:  Wt 160 lb (72.6 kg)   BMI 24.33 kg/m   Physical Exam Constitutional:      General: He is not in acute distress. Musculoskeletal:        General: No deformity.  Neurological:     Mental  Status: He is alert and oriented to person, place, and time.     Coordination: Coordination normal.  Psychiatric:        Attention and Perception: Attention and perception normal. He does not perceive auditory or visual hallucinations.        Mood and Affect: Mood normal. Mood is not anxious or depressed. Affect is not labile, blunt, angry or inappropriate.        Speech: Speech normal.        Behavior: Behavior normal.        Thought Content: Thought content normal. Thought content is not paranoid or delusional. Thought content does not include homicidal or suicidal ideation. Thought content does not include homicidal or suicidal plan.        Cognition and Memory: Cognition and memory normal.        Judgment: Judgment normal.     Comments: Insight intact     Lab Review:     Component Value Date/Time   NA 139 10/04/2019 1947   K 4.5 10/04/2019 1947   CL 101 10/04/2019 1947   CO2 22 10/04/2019 1947   GLUCOSE 99 10/04/2019 1947   BUN 11 10/04/2019 1947   CREATININE 0.93 10/04/2019 1947   CALCIUM 9.0 10/04/2019 1947   PROT 8.5 (H) 10/04/2019 1947   ALBUMIN 5.1 (H) 10/04/2019 1947   AST 53 (H) 10/04/2019 1947   ALT 55 (H) 10/04/2019 1947   ALKPHOS 59 10/04/2019 1947   BILITOT 0.6 10/04/2019 1947   GFRNONAA >60 10/04/2019 1947   GFRAA >60 10/04/2019 1947       Component Value Date/Time   WBC 6.5 10/04/2019 1947   RBC 5.25 10/04/2019 1947   HGB 16.1 10/04/2019 1947   HCT 46.0 10/04/2019 1947   PLT 241 10/04/2019 1947   MCV 87.6 10/04/2019 1947   MCH 30.7 10/04/2019 1947   MCHC 35.0 10/04/2019 1947   RDW 12.7 10/04/2019 1947   LYMPHSABS 2.4 06/06/2018 1726   MONOABS 0.5 06/06/2018 1726   EOSABS 0.2 06/06/2018 1726   BASOSABS 0.1 06/06/2018 1726    No results found for: "POCLITH", "LITHIUM"   No results found for: "PHENYTOIN", "PHENOBARB", "VALPROATE", "CBMZ"   .res Assessment: Plan:    31 minutes spent dedicated to the care of this patient on the date of this  encounter to include pre-visit review of records, ordering of medication, post visit documentation, and face-to-face time with the patient discussing goal of continuing to taper off of medications. Discussed continuing to decrease Seroquel to 25 mg 1-2 tabs at bedtime. Recommended reducing dose gradually to minimize risk of rebound insomnia. Discussed that he could also take Seroquel 37.5 mg at bedtime if having difficulty reducing from 50 mg to 25 mg at bedtime. Discussed that he could stop Seroquel if not longer needed.  Discussed decreasing Naltrexone to 1/2 tablet daily until current supply is completed.  Will continue Sertraline 50 mg daily for mood and anxiety.  Pt to follow-up in 6 months or sooner if clinically indicated.  Patient advised to contact office with any questions, adverse effects, or acute worsening in signs and symptoms.    Victor [name] was seen today for follow-up.  Diagnoses and all orders for this visit:  Generalized anxiety disorder  Major depression, recurrent, full remission (HCC)  Insomnia, unspecified type -     QUEtiapine (SEROQUEL) 25 MG tablet; Take 1-2 tablets at bedtime  Alcohol dependence in remission (HCC) -     naltrexone (DEPADE) 50 MG tablet; Take 1/2 tablet daily until current supply is completed, then stop     Please see After Visit Summary for patient specific instructions.  Future Appointments  Date Time Provider Department Center  05/29/2023  2:30 PM Corie Chiquito, PMHNP [facility] None    No orders of the defined types were placed in this encounter.   -------------------------------

## 2022-12-26 ENCOUNTER — Ambulatory Visit
Admission: RE | Admit: 2022-12-26 | Discharge: 2022-12-26 | Disposition: A | Discharge status: Home / Self Care | Payer: 59 | Source: Ambulatory Visit | Attending: Family Medicine | Admitting: Family Medicine

## 2022-12-26 ENCOUNTER — Other Ambulatory Visit: Payer: Self-pay | Admitting: Family Medicine

## 2022-12-26 DIAGNOSIS — R079 Chest pain, unspecified: Secondary | ICD-10-CM

## 2023-02-15 ENCOUNTER — Encounter: Payer: Self-pay | Admitting: Psychiatry

## 2023-05-29 ENCOUNTER — Ambulatory Visit: Payer: 59 | Admitting: Psychiatry

## 2023-05-29 ENCOUNTER — Ambulatory Visit (INDEPENDENT_AMBULATORY_CARE_PROVIDER_SITE_OTHER): Payer: 59 | Admitting: Physician Assistant

## 2023-05-29 ENCOUNTER — Encounter: Payer: Self-pay | Admitting: Physician Assistant

## 2023-05-29 DIAGNOSIS — F411 Generalized anxiety disorder: Secondary | ICD-10-CM

## 2023-05-29 DIAGNOSIS — G4700 Insomnia, unspecified: Secondary | ICD-10-CM

## 2023-05-29 DIAGNOSIS — F4010 Social phobia, unspecified: Secondary | ICD-10-CM

## 2023-05-29 MED ORDER — QUETIAPINE FUMARATE 25 MG PO TABS: ORAL_TABLET | ORAL | 1 refills | Status: DC

## 2023-05-29 MED ORDER — SERTRALINE HCL 50 MG PO TABS
50.0000 mg | ORAL_TABLET | Freq: Every day | ORAL | 1 refills | Status: DC
Start: 2023-05-29 — End: 2023-11-27

## 2023-05-29 NOTE — Progress Notes (Signed)
 [facility]  Patient ID: Victor [name],  MRN: [id number]  PCP: Daisy Floro, MD  Date of Evaluation: 05/29/2023 Time spent:20 minutes  Chief Complaint:  Chief Complaint   Medication Refill    HISTORY/CURRENT STATUS: HPI Transfer from Corie Chiquito, NP who is no longer with our practice.   He's doing well on current meds and doesn't feel like anything needs to be changed. Went off naltrexone as he and Shanda Bumps had discussed.  He has not had any cravings for alcohol.  It was seen before years that he quit drinking.  Patient is able to enjoy things.  Energy and motivation are good.  Work is going well.   No extreme sadness, tearfulness, or feelings of hopelessness.  Sleeps well most of the time.  Takes Seroquel for that reason and it is effective.  ADLs and personal hygiene are normal.   Denies any changes in concentration, making decisions, or remembering things.  Appetite has not changed.  Weight is stable.  No complaints of anxiety.  Denies suicidal or homicidal thoughts.  Patient denies increased energy with decreased need for sleep, increased talkativeness, racing thoughts, impulsivity or risky behaviors, increased spending, increased libido, grandiosity, increased irritability or anger, paranoia, or hallucinations.  Denies dizziness, syncope, seizures, numbness, tingling, tremor, tics, unsteady gait, slurred speech, confusion. Denies muscle or joint pain, stiffness, or dystonia.  Individual Medical History/ Review of Systems: Changes? :No   Past Psychiatric Medication Trials: Propranolol- Somewhat effective Gabapentin-ineffective Adderall XR Adderall Vyvanse Xanax Hydroxyzine Trazodone- Ineffective Sertraline Seroquel- Ineffective at 50 mg dose. Helpful for sleep at 100 mg dose. Naltrexone Carbamazepine- prescribed for detox  Allergies: Patient has no known allergies.  Current Medications:  Current Outpatient Medications:    Multiple Vitamins-Minerals  (ONE DAILY MULTIVITAMIN MEN PO), Take by mouth., Disp: , Rfl:    QUEtiapine (SEROQUEL) 25 MG tablet, Take 1-2 tablets at bedtime, Disp: 180 tablet, Rfl: 1   sertraline (ZOLOFT) 50 MG tablet, Take 1 tablet (50 mg total) by mouth daily., Disp: 90 tablet, Rfl: 1  Current Facility-Administered Medications:    0.9 %  sodium chloride infusion, 500 mL, Intravenous, Once, Mansouraty, Netty Starring., MD Medication Side Effects: none  Family Medical/ Social History: Changes? No  MENTAL HEALTH EXAM:  There were no vitals taken for this visit.There is no height or weight on file to calculate BMI.  General Appearance: Casual and Well Groomed  Eye Contact:  Good  Speech:  Clear and Coherent and Normal Rate  Volume:  Normal  Mood:  Euthymic  Affect:  Congruent  Thought Process:  Goal Directed and Descriptions of Associations: Circumstantial  Orientation:  Full (Time, Place, and Person)  Thought Content: Logical   Suicidal Thoughts:  No  Homicidal Thoughts:  No  Memory:  WNL  Judgement:  Good  Insight:  Good  Psychomotor Activity:  Normal  Concentration:  Concentration: Good  Recall:  Good  Fund of Knowledge: Good  Language: Good  Assets:  Desire for Improvement Financial Resources/Insurance Housing Transportation Vocational/Educational  ADL's:  Intact  Cognition: WNL  Prognosis:  Good   DIAGNOSES:    ICD-10-CM   1. Generalized anxiety disorder  F41.1 sertraline (ZOLOFT) 50 MG tablet    2. Social anxiety disorder  F40.10 sertraline (ZOLOFT) 50 MG tablet    3. Insomnia, unspecified type  G47.00 QUEtiapine (SEROQUEL) 25 MG tablet      Receiving Psychotherapy: No   RECOMMENDATIONS:   PDMP reviewed.  Xanax filled 10/15/2022. I provided 20 minutes of  face to face time during this encounter, including time spent before and after the visit in records review, medical decision making, counseling pertinent to today's visit, and charting.   He is doing well on current medications and no  changes will be made.  If he has any craving for alcohol whatsoever, call and we will restart the naltrexone.  Continue Seroquel 25 mg, 1-2 p.o. nightly. Continue Zoloft 50 mg, 1 p.o. daily. Continue multivitamin. Return in 6 months.  Melony Overly, PA-C

## 2023-07-07 ENCOUNTER — Other Ambulatory Visit: Payer: Self-pay | Admitting: Physician Assistant

## 2023-07-07 NOTE — Telephone Encounter (Signed)
 Daril called at 9:15 to request that he be re-prescribed propranolol.  Hasn't taken it in a while but you two talked about taking it again if needed.  Appt 8/25.  Send to   [id number] - [place], [place] - [address] [place] [address]

## 2023-07-10 MED ORDER — PROPRANOLOL HCL 10 MG PO TABS: 10.0000 mg | ORAL_TABLET | Freq: Two times a day (BID) | ORAL | 0 refills | Status: DC

## 2023-07-10 NOTE — Telephone Encounter (Signed)
 Pended Rx for propranolol.

## 2023-08-01 ENCOUNTER — Other Ambulatory Visit: Payer: Self-pay | Admitting: Behavioral Health

## 2023-09-13 ENCOUNTER — Other Ambulatory Visit: Payer: Self-pay

## 2023-09-13 ENCOUNTER — Encounter (HOSPITAL_COMMUNITY): Payer: Self-pay

## 2023-09-13 ENCOUNTER — Emergency Department (HOSPITAL_COMMUNITY)
Admission: EM | Admit: 2023-09-13 | Discharge: 2023-09-13 | Disposition: A | Discharge status: Home / Self Care | Payer: Worker's Compensation | Attending: Emergency Medicine | Admitting: Emergency Medicine

## 2023-09-13 DIAGNOSIS — T23001A Burn of unspecified degree of right hand, unspecified site, initial encounter: Secondary | ICD-10-CM | POA: Diagnosis not present

## 2023-09-13 DIAGNOSIS — Y929 Unspecified place or not applicable: Secondary | ICD-10-CM | POA: Diagnosis not present

## 2023-09-13 DIAGNOSIS — W868XXA Exposure to other electric current, initial encounter: Secondary | ICD-10-CM | POA: Insufficient documentation

## 2023-09-13 DIAGNOSIS — T754XXA Electrocution, initial encounter: Secondary | ICD-10-CM | POA: Diagnosis present

## 2023-09-13 DIAGNOSIS — Y990 Civilian activity done for income or pay: Secondary | ICD-10-CM | POA: Insufficient documentation
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
## 2023-09-13 NOTE — Discharge Instructions (Signed)
 Follow up with your doctor in the office.

## 2023-11-04 ENCOUNTER — Other Ambulatory Visit: Payer: Self-pay | Admitting: Physician Assistant

## 2023-11-27 ENCOUNTER — Ambulatory Visit (INDEPENDENT_AMBULATORY_CARE_PROVIDER_SITE_OTHER): Payer: 59 | Admitting: Physician Assistant

## 2023-11-27 ENCOUNTER — Encounter: Payer: Self-pay | Admitting: Physician Assistant

## 2023-11-27 VITALS — BP 131/92 | HR 78

## 2023-11-27 DIAGNOSIS — G4700 Insomnia, unspecified: Secondary | ICD-10-CM

## 2023-11-27 DIAGNOSIS — F411 Generalized anxiety disorder: Secondary | ICD-10-CM | POA: Diagnosis not present

## 2023-11-27 DIAGNOSIS — F4010 Social phobia, unspecified: Secondary | ICD-10-CM | POA: Diagnosis not present

## 2023-11-27 MED ORDER — ALPRAZOLAM 0.5 MG PO TABS: 0.2500 mg | ORAL_TABLET | Freq: Two times a day (BID) | ORAL | 0 refills | Status: AC | PRN

## 2023-11-27 MED ORDER — SERTRALINE HCL 50 MG PO TABS: 50.0000 mg | ORAL_TABLET | Freq: Every day | ORAL | 1 refills | Status: AC

## 2023-11-27 MED ORDER — QUETIAPINE FUMARATE 25 MG PO TABS: ORAL_TABLET | ORAL | 1 refills | Status: AC

## 2023-11-27 NOTE — Progress Notes (Unsigned)
 [facility]  Patient ID: Victor [name],  MRN: [id number]  PCP: Okey Carlin Redbird, MD  Date of Evaluation: 11/27/2023 Time spent:20 minutes  Chief Complaint:  Chief Complaint   Follow-up; Anxiety; Insomnia    HISTORY/CURRENT STATUS: HPI  For routine med check.   Doing well. Meds are still effective.  Has social anxiety at times but overall the Zoloft  has helped prevent it. No sense of impending doom. Is able to control worry.  Denies tendency to avoid of things that may trigger anxiety.  He does have palpitations sometimes, Propranolol  prn helps.   Patient is able to enjoy things.  Energy and motivation are good.  Work is going well.   No extreme sadness, tearfulness, or feelings of hopelessness.  Sleeps well with the Seroquel .  ADLs and personal hygiene are normal.   Appetite has not changed.  Weight is stable.  No mania, delirium, AH/VH.  No SI/HI.  Attention is good without easy distractibility.  Able to focus on things and finish tasks to completion.   Individual Medical History/ Review of Systems: Changes? :No   Past Psychiatric Medication Trials: Propranolol - Somewhat effective Gabapentin -ineffective Adderall XR Adderall Vyvanse  Xanax  Hydroxyzine  Trazodone - Ineffective Sertraline  Seroquel - Ineffective at 50 mg dose. Helpful for sleep at 100 mg dose. Naltrexone  Carbamazepine- prescribed for detox  Allergies: Patient has no known allergies.  Current Medications:  Current Outpatient Medications:    ALPRAZolam  (XANAX ) 0.5 MG tablet, Take 0.5-1 tablets (0.25-0.5 mg total) by mouth 2 (two) times daily as needed for anxiety., Disp: 10 tablet, Rfl: 0   Multiple Vitamins-Minerals (ONE DAILY MULTIVITAMIN MEN PO), Take by mouth., Disp: , Rfl:    propranolol  (INDERAL ) 10 MG tablet, TAKE 1-2 TABLETS (10-20 MG TOTAL) BY MOUTH 2 (TWO) TIMES DAILY., Disp: 120 tablet, Rfl: 0   QUEtiapine  (SEROQUEL ) 25 MG tablet, Take 1-2 tablets at bedtime, Disp: 180 tablet, Rfl: 1    sertraline  (ZOLOFT ) 50 MG tablet, Take 1 tablet (50 mg total) by mouth daily., Disp: 90 tablet, Rfl: 1  Current Facility-Administered Medications:    0.9 %  sodium chloride  infusion, 500 mL, Intravenous, Once, Mansouraty, Gabriel Jr., MD Medication Side Effects: none  Family Medical/ Social History: Changes? No  MENTAL HEALTH EXAM:  Blood pressure (!) 131/92, pulse 78.There is no height or weight on file to calculate BMI.  General Appearance: Casual and Well Groomed  Eye Contact:  Good  Speech:  Clear and Coherent and Normal Rate  Volume:  Normal  Mood:  Euthymic  Affect:  Congruent  Thought Process:  Goal Directed and Descriptions of Associations: Circumstantial  Orientation:  Full (Time, Place, and Person)  Thought Content: Logical   Suicidal Thoughts:  No  Homicidal Thoughts:  No  Memory:  WNL  Judgement:  Good  Insight:  Good  Psychomotor Activity:  Normal  Concentration:  Concentration: Good  Recall:  Good  Fund of Knowledge: Good  Language: Good  Assets:  Desire for Improvement Financial Resources/Insurance Housing Resilience Transportation Vocational/Educational  ADL's:  Intact  Cognition: WNL  Prognosis:  Good   DIAGNOSES:    ICD-10-CM   1. Generalized anxiety disorder  F41.1 sertraline  (ZOLOFT ) 50 MG tablet    2. Insomnia, unspecified type  G47.00 QUEtiapine  (SEROQUEL ) 25 MG tablet    3. Social anxiety disorder  F40.10 sertraline  (ZOLOFT ) 50 MG tablet     Receiving Psychotherapy: No   RECOMMENDATIONS:   PDMP reviewed.  Xanax  filled 10/15/2022. I provided approximately 20 minutes of face to face time during  this encounter, including time spent before and after the visit in records review, medical decision making, counseling pertinent to today's visit, and charting.   He's doing well overall but would benefit by having a few Xanax  on hand.  He will have his wife keep and dispense it when needed.   Start Xanax  0.5 mg, 1/2-1 p.o. twice daily as  needed. Continue propranolol  10 mg, 1-2 p.o. twice daily as needed anxiety. Continue Seroquel  25 mg, 1-2 p.o. nightly. Continue Zoloft  50 mg, 1 p.o. daily. Continue multivitamin. Return in 6 months.  Verneita Cooks, PA-C

## 2023-12-03 ENCOUNTER — Other Ambulatory Visit: Payer: Self-pay | Admitting: Physician Assistant

## 2024-03-05 ENCOUNTER — Other Ambulatory Visit: Payer: Self-pay | Admitting: Physician Assistant

## 2024-03-08 ENCOUNTER — Ambulatory Visit: Admitting: Physician Assistant

## 2024-03-08 ENCOUNTER — Encounter: Payer: Self-pay | Admitting: Physician Assistant

## 2024-03-08 DIAGNOSIS — G4700 Insomnia, unspecified: Secondary | ICD-10-CM | POA: Diagnosis not present

## 2024-03-08 DIAGNOSIS — F4010 Social phobia, unspecified: Secondary | ICD-10-CM

## 2024-03-08 DIAGNOSIS — F411 Generalized anxiety disorder: Secondary | ICD-10-CM | POA: Diagnosis not present

## 2024-03-08 DIAGNOSIS — F17290 Nicotine dependence, other tobacco product, uncomplicated: Secondary | ICD-10-CM | POA: Diagnosis not present

## 2024-03-08 MED ORDER — VARENICLINE TARTRATE 0.5 MG PO TABS: 0.5000 mg | ORAL_TABLET | Freq: Two times a day (BID) | ORAL | 0 refills | Status: DC

## 2024-03-08 MED ORDER — VARENICLINE TARTRATE 1 MG PO TABS: 1.0000 mg | ORAL_TABLET | Freq: Two times a day (BID) | ORAL | 2 refills | Status: AC

## 2024-03-08 NOTE — Progress Notes (Signed)
 [facility]  Patient ID: Victor [name],  MRN: [id number]  PCP: Okey Carlin Redbird, MD  Date of Evaluation: 03/08/2024 Time spent:20 minutes  Chief Complaint:  Chief Complaint   Anxiety; Follow-up    HISTORY/CURRENT STATUS: HPI  For routine med check.   Wants to quit vaping.  Has smoked several years ago, then vaped, back to nicotine  pouches, then vaping again, etc. He's quit for a short while but then went back to it again. Asks about options.  Doesn't want to use Nicoderm or nicorette , b/c it's still giving him nicotine .    He sleeps well with the Seroquel .  Work is going fine.  No sx of depression, anxiety, mania, psychosis, no SI/HI.   Individual Medical History/ Review of Systems: Changes? :No   Past Psychiatric Medication Trials: Propranolol - Somewhat effective Gabapentin -ineffective Adderall XR Adderall Vyvanse  Xanax  Hydroxyzine  Trazodone - Ineffective Sertraline  Seroquel - Ineffective at 50 mg dose. Helpful for sleep at 100 mg dose. Naltrexone  Carbamazepine- prescribed for detox  Allergies: Patient has no known allergies.  Current Medications:  Current Outpatient Medications:    ALPRAZolam  (XANAX ) 0.5 MG tablet, Take 0.5-1 tablets (0.25-0.5 mg total) by mouth 2 (two) times daily as needed for anxiety. (Patient taking differently: Take 0.25-0.5 mg by mouth 2 (two) times daily as needed for anxiety. Not needing.), Disp: 10 tablet, Rfl: 0   Multiple Vitamins-Minerals (ONE DAILY MULTIVITAMIN MEN PO), Take by mouth., Disp: , Rfl:    propranolol  (INDERAL ) 10 MG tablet, TAKE 1-2 TABLETS (10-20 MG TOTAL) BY MOUTH 2 (TWO) TIMES DAILY., Disp: 120 tablet, Rfl: 0   QUEtiapine  (SEROQUEL ) 25 MG tablet, Take 1-2 tablets at bedtime, Disp: 180 tablet, Rfl: 1   sertraline  (ZOLOFT ) 50 MG tablet, Take 1 tablet (50 mg total) by mouth daily., Disp: 90 tablet, Rfl: 1   varenicline  (CHANTIX  CONTINUING MONTH PAK) 1 MG tablet, Take 1 tablet (1 mg total) by mouth 2 (two) times  daily., Disp: 60 tablet, Rfl: 2   varenicline  (CHANTIX ) 0.5 MG tablet, Take 1 tablet (0.5 mg total) by mouth 2 (two) times daily., Disp: 60 tablet, Rfl: 0  Current Facility-Administered Medications:    0.9 %  sodium chloride  infusion, 500 mL, Intravenous, Once, Mansouraty, Gabriel Jr., MD Medication Side Effects: none  Family Medical/ Social History: Changes? No  MENTAL HEALTH EXAM:  There were no vitals taken for this visit.There is no height or weight on file to calculate BMI.  General Appearance: Casual and Well Groomed  Eye Contact:  Good  Speech:  Clear and Coherent and Normal Rate  Volume:  Normal  Mood:  Euthymic  Affect:  Congruent  Thought Process:  Goal Directed and Descriptions of Associations: Circumstantial  Orientation:  Full (Time, Place, and Person)  Thought Content: Logical   Suicidal Thoughts:  No  Homicidal Thoughts:  No  Memory:  WNL  Judgement:  Good  Insight:  Good  Psychomotor Activity:  Normal  Concentration:  Concentration: Good  Recall:  Good  Fund of Knowledge: Good  Language: Good  Assets:  Communication Skills Desire for Improvement Financial Resources/Insurance Housing Resilience Transportation Vocational/Educational  ADL's:  Intact  Cognition: WNL  Prognosis:  Good   DIAGNOSES:    ICD-10-CM   1. Other tobacco product nicotine  dependence, uncomplicated  F17.290     2. Generalized anxiety disorder  F41.1     3. Social anxiety disorder  F40.10     4. Insomnia, unspecified type  G47.00       Receiving Psychotherapy: No  RECOMMENDATIONS:   PDMP reviewed.  Xanax  filled 11/27/2023.   I provided approximately 20 minutes of face to face time during this encounter, including time spent before and after the visit in records review, medical decision making, counseling pertinent to today's visit, and charting.   We discussed options for nicotine  (vaping) cessation, including Chantix  and Wellbutrin.  Risks, benefits, and possible SE on both  meds were discussed.  We agreed to start Chantix .  If it causes depression, intolerable dreams, or SI, call and we'll change to Wellbutrin.  Take Xanax  0.5 mg, 1/2-1 p.o. twice daily as needed.(He's never taken it, his wife is in charge of it.) Continue propranolol  10 mg, 1-2 p.o. twice daily as needed anxiety. Continue Seroquel  25 mg, 1-2 p.o. nightly. Continue Zoloft  50 mg, 1 p.o. daily. Start Chantix  as directed.  Continue multivitamin. Return in 6-8 weeks.   Verneita Cooks, PA-C

## 2024-03-10 ENCOUNTER — Encounter: Payer: Self-pay | Admitting: Physician Assistant

## 2024-03-31 ENCOUNTER — Other Ambulatory Visit: Payer: Self-pay | Admitting: Physician Assistant

## 2024-03-31 NOTE — Telephone Encounter (Signed)
Is he still taking?

## 2024-04-02 NOTE — Telephone Encounter (Signed)
 Pt said he has medication and does not need a RF.

## 2024-04-04 ENCOUNTER — Other Ambulatory Visit: Payer: Self-pay | Admitting: Physician Assistant

## 2024-04-05 ENCOUNTER — Ambulatory Visit: Admitting: Physician Assistant

## 2024-05-03 ENCOUNTER — Encounter: Payer: Self-pay | Admitting: Physician Assistant

## 2024-05-03 ENCOUNTER — Ambulatory Visit: Admitting: Physician Assistant

## 2024-05-03 DIAGNOSIS — F4010 Social phobia, unspecified: Secondary | ICD-10-CM

## 2024-05-03 DIAGNOSIS — F17291 Nicotine dependence, other tobacco product, in remission: Secondary | ICD-10-CM

## 2024-05-03 DIAGNOSIS — F3342 Major depressive disorder, recurrent, in full remission: Secondary | ICD-10-CM

## 2024-05-03 NOTE — Progress Notes (Signed)
 "     [facility]  Patient ID: Victor [name],  MRN: [id number]  PCP: Okey Carlin Redbird, MD  Date of Evaluation: 05/03/2024 Time spent:20 minutes  Chief Complaint:  Chief Complaint   Anxiety; Depression    HISTORY/CURRENT STATUS: HPI  For routine med check.   He is doing really well.  He is finishing up his second month of the Chantix  and he quit vaping the first week of January.  He does have a few cravings here and there but he uses either gum (nonnicotine) or a mint to help with that.  He then forgets about it and goes on about his day.  He is not having any side effects from the Chantix .  He already has odd dreams and they are no different.  No sx of depression.  Work is going well.   No extreme sadness, tearfulness, or feelings of hopelessness.  Sleeps well.  ADLs and personal hygiene are normal.  Eating well. Anxiety is well-controlled.   No mania, delirium, AH/VH.  No SI/HI.  Individual Medical History/ Review of Systems: Changes? :No   Past Psychiatric Medication Trials: Propranolol - Somewhat effective Gabapentin -ineffective Adderall XR Adderall Vyvanse  Xanax  Hydroxyzine  Trazodone - Ineffective Sertraline  Seroquel - Ineffective at 50 mg dose. Helpful for sleep at 100 mg dose. Naltrexone  Carbamazepine- prescribed for detox  Allergies: Patient has no known allergies.  Current Medications:  Current Outpatient Medications:    ALPRAZolam  (XANAX ) 0.5 MG tablet, Take 0.5-1 tablets (0.25-0.5 mg total) by mouth 2 (two) times daily as needed for anxiety., Disp: 10 tablet, Rfl: 0   Multiple Vitamins-Minerals (ONE DAILY MULTIVITAMIN MEN PO), Take by mouth., Disp: , Rfl:    propranolol  (INDERAL ) 10 MG tablet, TAKE 1-2 TABLETS (10-20 MG TOTAL) BY MOUTH 2 (TWO) TIMES DAILY., Disp: 120 tablet, Rfl: 0   QUEtiapine  (SEROQUEL ) 25 MG tablet, Take 1-2 tablets at bedtime, Disp: 180 tablet, Rfl: 1   sertraline  (ZOLOFT ) 50 MG tablet, Take 1 tablet (50 mg total) by mouth daily., Disp:  90 tablet, Rfl: 1   varenicline  (CHANTIX  CONTINUING MONTH PAK) 1 MG tablet, Take 1 tablet (1 mg total) by mouth 2 (two) times daily., Disp: 60 tablet, Rfl: 2  Current Facility-Administered Medications:    0.9 %  sodium chloride  infusion, 500 mL, Intravenous, Once, Mansouraty, Gabriel Jr., MD Medication Side Effects: none  Family Medical/ Social History: Changes? No  MENTAL HEALTH EXAM:  There were no vitals taken for this visit.There is no height or weight on file to calculate BMI.  General Appearance: Casual and Well Groomed  Eye Contact:  Good  Speech:  Clear and Coherent and Normal Rate  Volume:  Normal  Mood:  Euthymic  Affect:  Congruent  Thought Process:  Goal Directed and Descriptions of Associations: Circumstantial  Orientation:  Full (Time, Place, and Person)  Thought Content: Logical   Suicidal Thoughts:  No  Homicidal Thoughts:  No  Memory:  WNL  Judgement:  Good  Insight:  Good  Psychomotor Activity:  Normal  Concentration:  Concentration: Good  Recall:  Good  Fund of Knowledge: Good  Language: Good  Assets:  Communication Skills Desire for Improvement Financial Resources/Insurance Housing Resilience Transportation Vocational/Educational  ADL's:  Intact  Cognition: WNL  Prognosis:  Good   DIAGNOSES:    ICD-10-CM   1. Major depression, recurrent, full remission  F33.42     2. Social anxiety disorder  F40.10     3. Other tobacco product nicotine  dependence in remission  F17.291  Receiving Psychotherapy: No   RECOMMENDATIONS:   PDMP reviewed.  Xanax  filled 11/27/2023.   I provided approximately 20 minutes of face to face time during this encounter, including time spent before and after the visit in records review, medical decision making, counseling pertinent to today's visit, and charting.   Congratulations on nicotine  cessation!  He is doing a great job.  He will continue the Chantix  for 1 more month (a total of 3 months) and then he can stop  it.  If he starts having cravings again even within a few days, he can take the Chantix  1 more month.  Take Xanax  0.5 mg, 1/2-1 p.o. twice daily as needed.(He's never taken it, his wife is in charge of it.) Continue propranolol  10 mg, 1-2 p.o. twice daily as needed anxiety. Continue Seroquel  25 mg,  2 p.o. nightly. Continue Zoloft  50 mg, 1 p.o. daily. Continue Chantix  as directed.  Continue multivitamin. Return in 3 months.  Verneita Cooks, PA-C  "

## 2024-05-04 ENCOUNTER — Other Ambulatory Visit: Payer: Self-pay | Admitting: Physician Assistant

## 2024-05-29 ENCOUNTER — Ambulatory Visit: Admitting: Physician Assistant

## 2024-08-01 ENCOUNTER — Ambulatory Visit (INDEPENDENT_AMBULATORY_CARE_PROVIDER_SITE_OTHER): Admitting: Physician Assistant
